# Patient Record
Sex: Female | Born: 1985 | Race: White | Hispanic: No | Marital: Single | Smoking: Never smoker
Health system: Southern US, Community
[De-identification: ages and names within clinical notes are randomized; demographics above are authoritative.]

## PROBLEM LIST (undated history)

## (undated) ENCOUNTER — Emergency Department: Admission: EM | Payer: 59

## (undated) DIAGNOSIS — T7840XA Allergy, unspecified, initial encounter: Secondary | ICD-10-CM

## (undated) DIAGNOSIS — N946 Dysmenorrhea, unspecified: Secondary | ICD-10-CM

## (undated) DIAGNOSIS — R634 Abnormal weight loss: Secondary | ICD-10-CM

## (undated) DIAGNOSIS — IMO0002 Reserved for concepts with insufficient information to code with codable children: Secondary | ICD-10-CM

## (undated) DIAGNOSIS — F439 Reaction to severe stress, unspecified: Secondary | ICD-10-CM

## (undated) DIAGNOSIS — L709 Acne, unspecified: Secondary | ICD-10-CM

## (undated) DIAGNOSIS — K209 Esophagitis, unspecified without bleeding: Secondary | ICD-10-CM

## (undated) DIAGNOSIS — N920 Excessive and frequent menstruation with regular cycle: Secondary | ICD-10-CM

## (undated) DIAGNOSIS — F419 Anxiety disorder, unspecified: Secondary | ICD-10-CM

## (undated) DIAGNOSIS — K219 Gastro-esophageal reflux disease without esophagitis: Secondary | ICD-10-CM

## (undated) DIAGNOSIS — L719 Rosacea, unspecified: Secondary | ICD-10-CM

## (undated) HISTORY — DX: Reaction to severe stress, unspecified: F43.9

## (undated) HISTORY — DX: Excessive and frequent menstruation with regular cycle: N92.0

## (undated) HISTORY — DX: Allergy, unspecified, initial encounter: T78.40XA

## (undated) HISTORY — DX: Reserved for concepts with insufficient information to code with codable children: IMO0002

## (undated) HISTORY — DX: Dysmenorrhea, unspecified: N94.6

## (undated) HISTORY — DX: Rosacea, unspecified: L71.9

## (undated) HISTORY — DX: Abnormal weight loss: R63.4

## (undated) HISTORY — DX: Anxiety disorder, unspecified: F41.9

## (undated) HISTORY — PX: LEEP: SHX91

## (undated) HISTORY — DX: Gastro-esophageal reflux disease without esophagitis: K21.9

## (undated) HISTORY — DX: Esophagitis, unspecified without bleeding: K20.90

## (undated) HISTORY — DX: Acne, unspecified: L70.9

## (undated) HISTORY — PX: EYE SURGERY: SHX253

## (undated) HISTORY — DX: Esophagitis, unspecified: K20.9

---

## 2005-04-18 ENCOUNTER — Ambulatory Visit: Payer: Self-pay | Admitting: Internal Medicine

## 2008-07-20 ENCOUNTER — Ambulatory Visit: Payer: Self-pay | Admitting: Internal Medicine

## 2009-06-11 ENCOUNTER — Ambulatory Visit: Payer: Self-pay | Admitting: General Practice

## 2010-12-10 IMAGING — US TRANSABDOMINAL ULTRASOUND OF PELVIS
1 series · 17 of 25 positions shown · non-contrast
Comparison: none

REASON FOR EXAM: continued spotting menstrual irregularities
COMMENTS:

[Series 1: transabdominal ultrasound of pelvis · 17 of 40 slices shown]
[im 1/40]
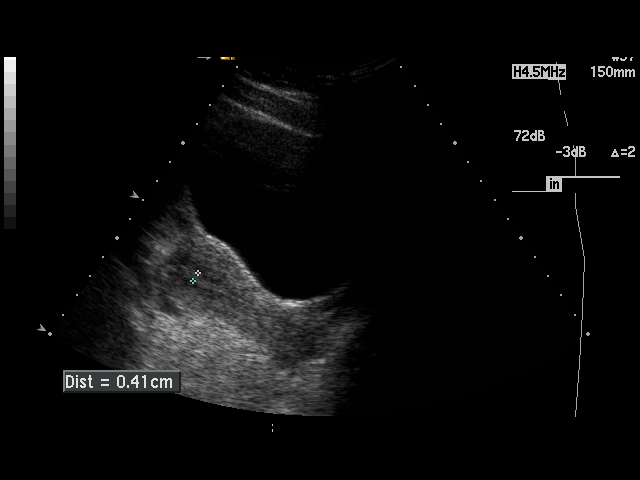
[im 4/40]
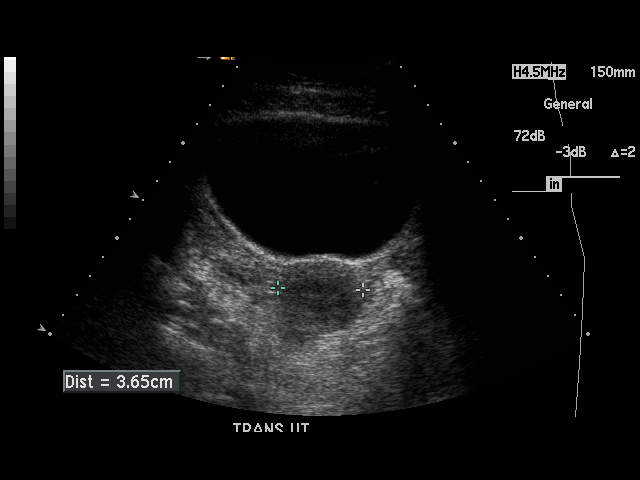
[im 5/40]
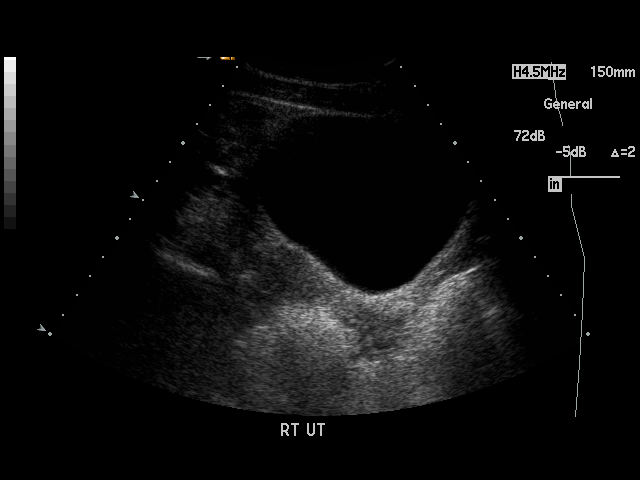
[im 9/40]
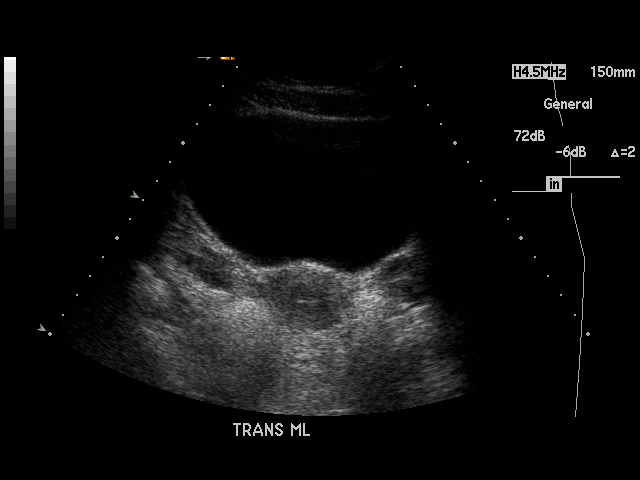
[im 10/40]
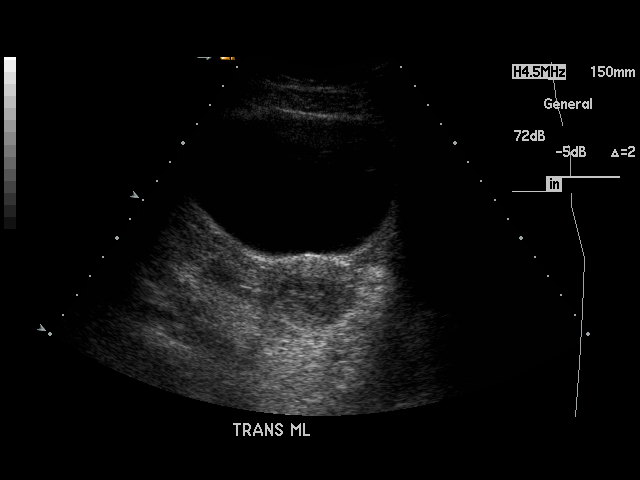
[im 14/40]
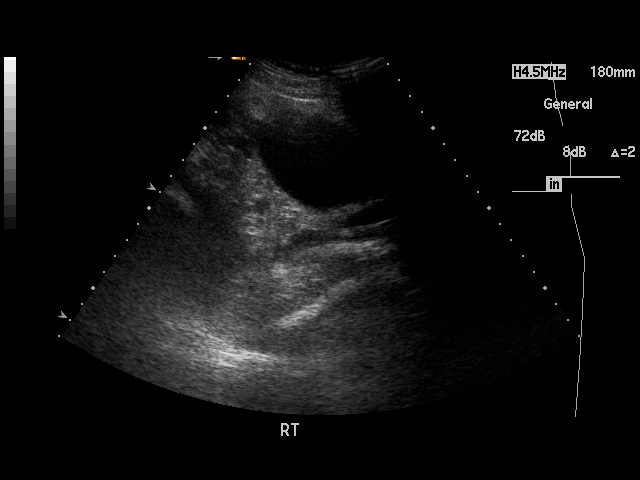
[im 15/40]
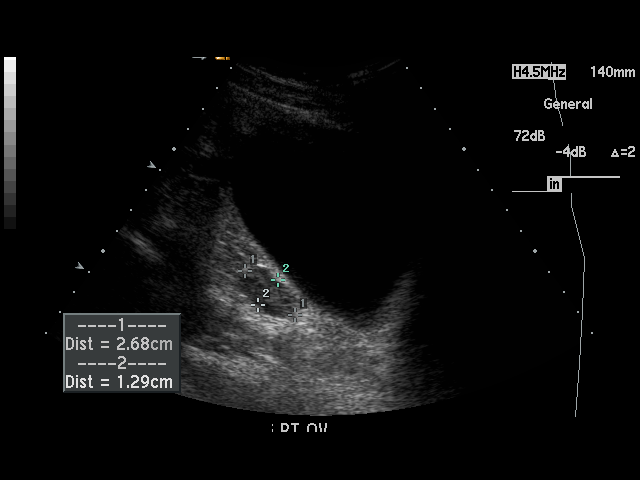
[im 18/40]
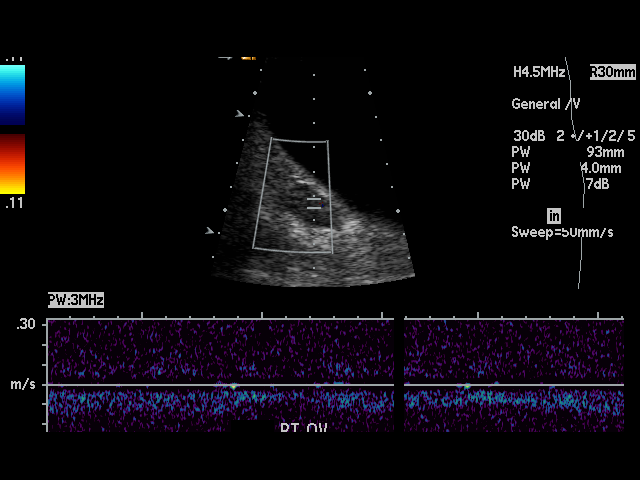
[im 20/40]
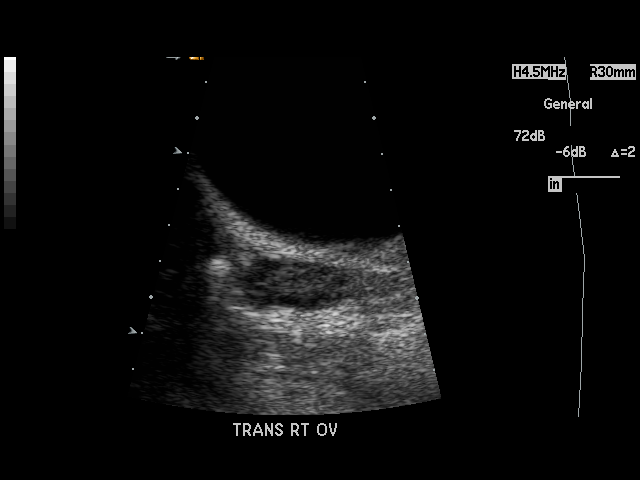
[im 22/40]
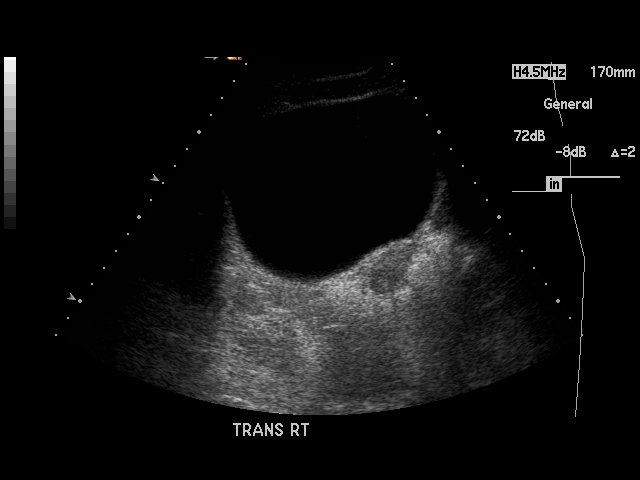
[im 25/40]
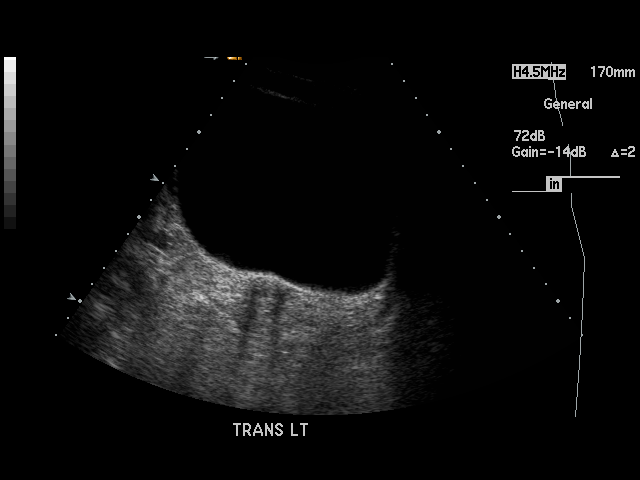
[im 27/40]
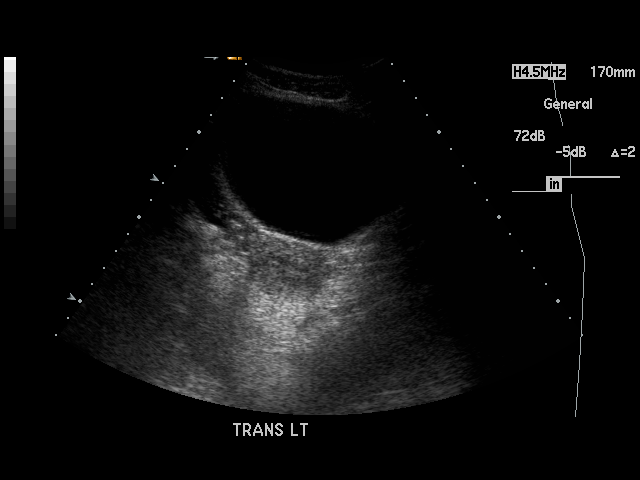
[im 30/40]
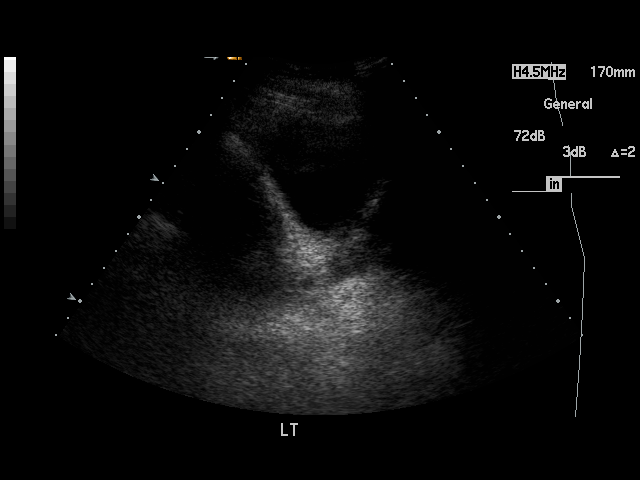
[im 31/40]
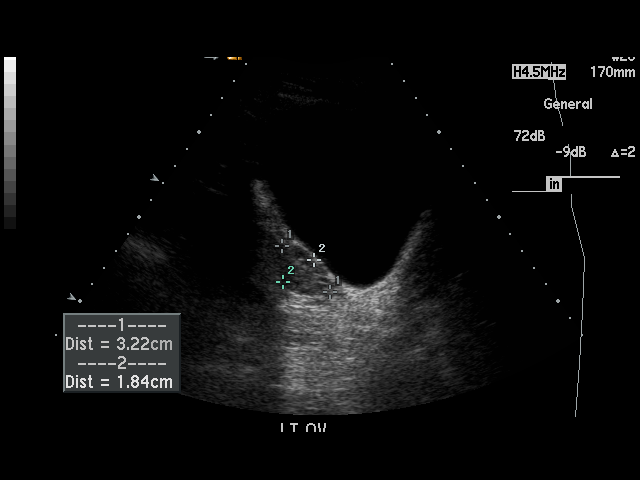
[im 35/40]
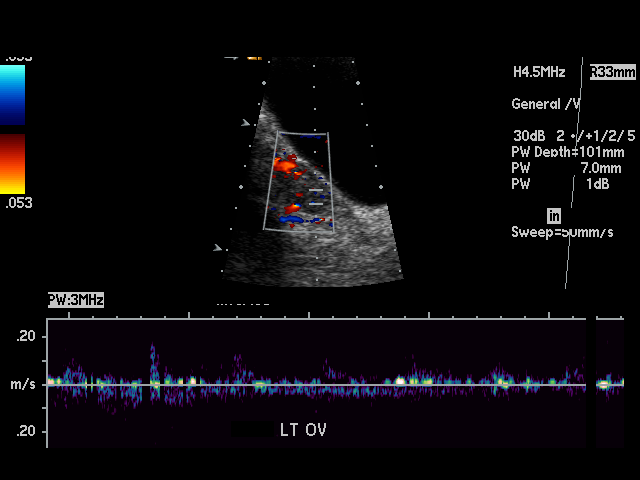
[im 36/40]
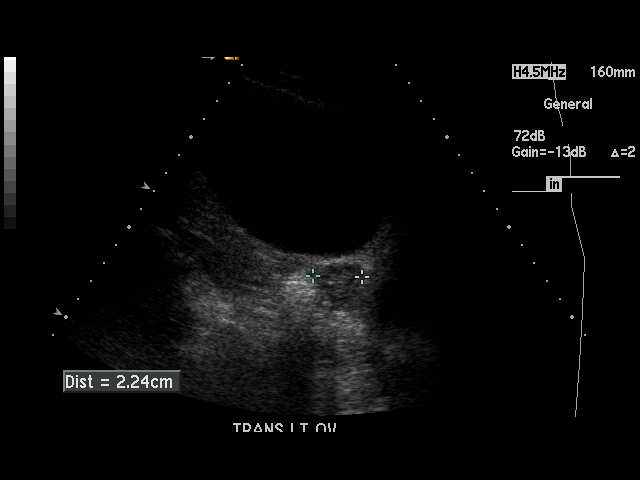
[im 40/40]
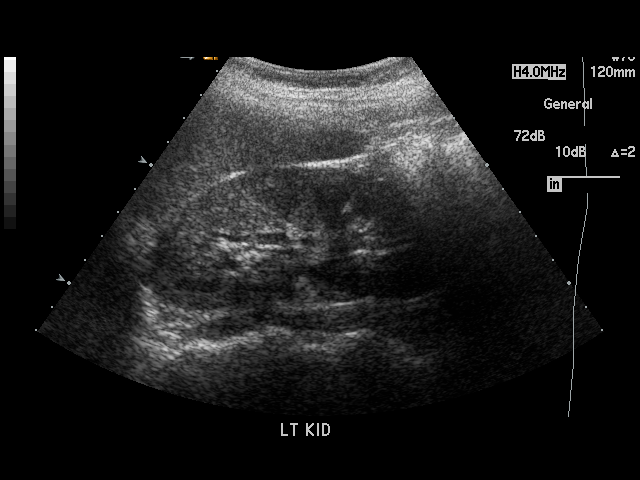

[17 of 25 positions shown; findings below may reference images not displayed]

PROCEDURE:     US  - US PELVIS MASS EXAM  - [DATE]  [DATE] [DATE]  [DATE]

RESULT:     The uterus measures 7.49 cm x 3.65 cm x 2.72 cm. No uterine mass
lesions are seen. The endometrium measures 4.1 mm in thickness. The right
and left ovaries are visualized. The right ovary measures 2.68 cm at maximum
diameter. The left ovary measures 3.22 cm at maximum diameter. No abnormal
adnexal masses are seen. There is no free fluid observed in the pelvis. The
visualized portion of the urinary bladder is normal in appearance. The
kidneys are visualized bilaterally and show no hydronephrosis.
IMPRESSION: No significant abnormalities are noted.

## 2011-11-01 IMAGING — CR DG CHEST 2V
1 series · 2 of 2 positions shown · non-contrast
Comparison: none

REASON FOR EXAM: respiratory clearance physical fax to [phone]
occupational health
COMMENTS:

PROCEDURE:     DXR - DXR CHEST 2 VIEWS JEANCARLO EMMERT  - June 11, 2009  [DATE]
RESULT:     The lungs are clear. The cardiac silhouette and visualized bony
skeleton are unremarkable.

[Series 1: view not recorded · 0.17mm/px · 2 of 2 slices shown]
[im 1/2]
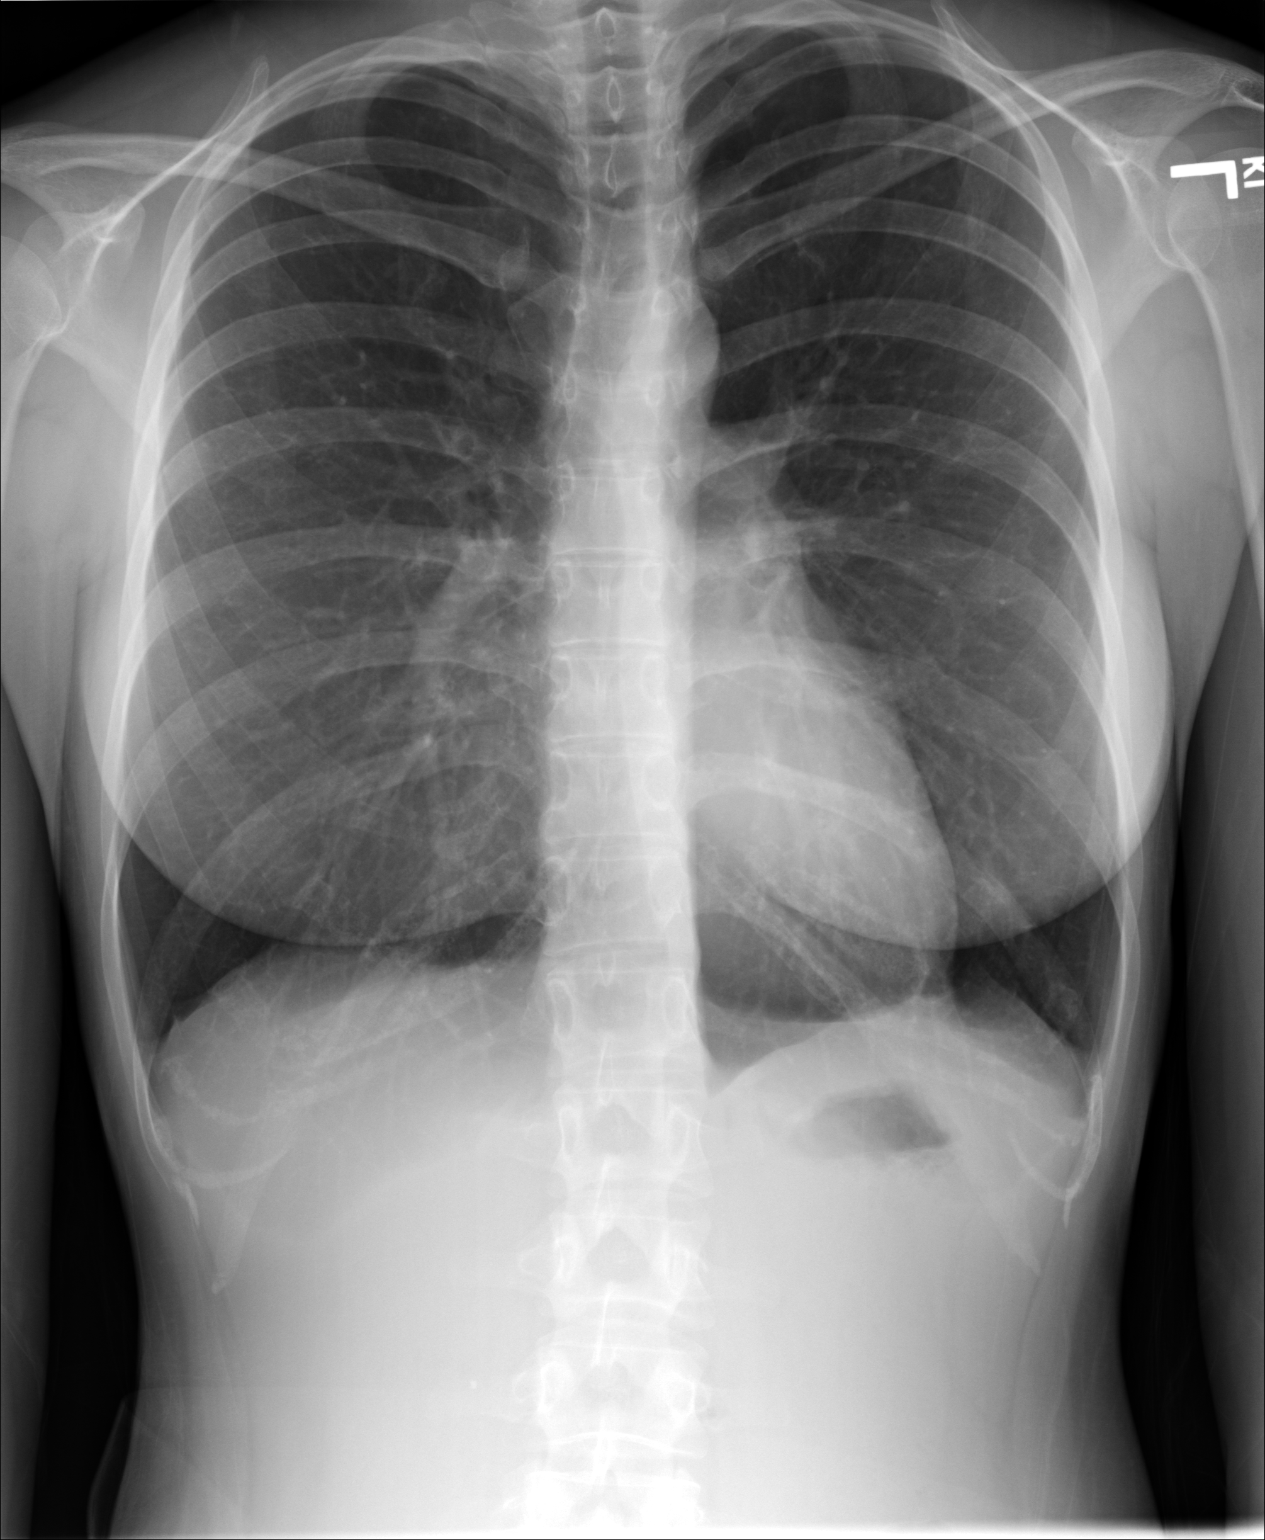
[im 2/2]
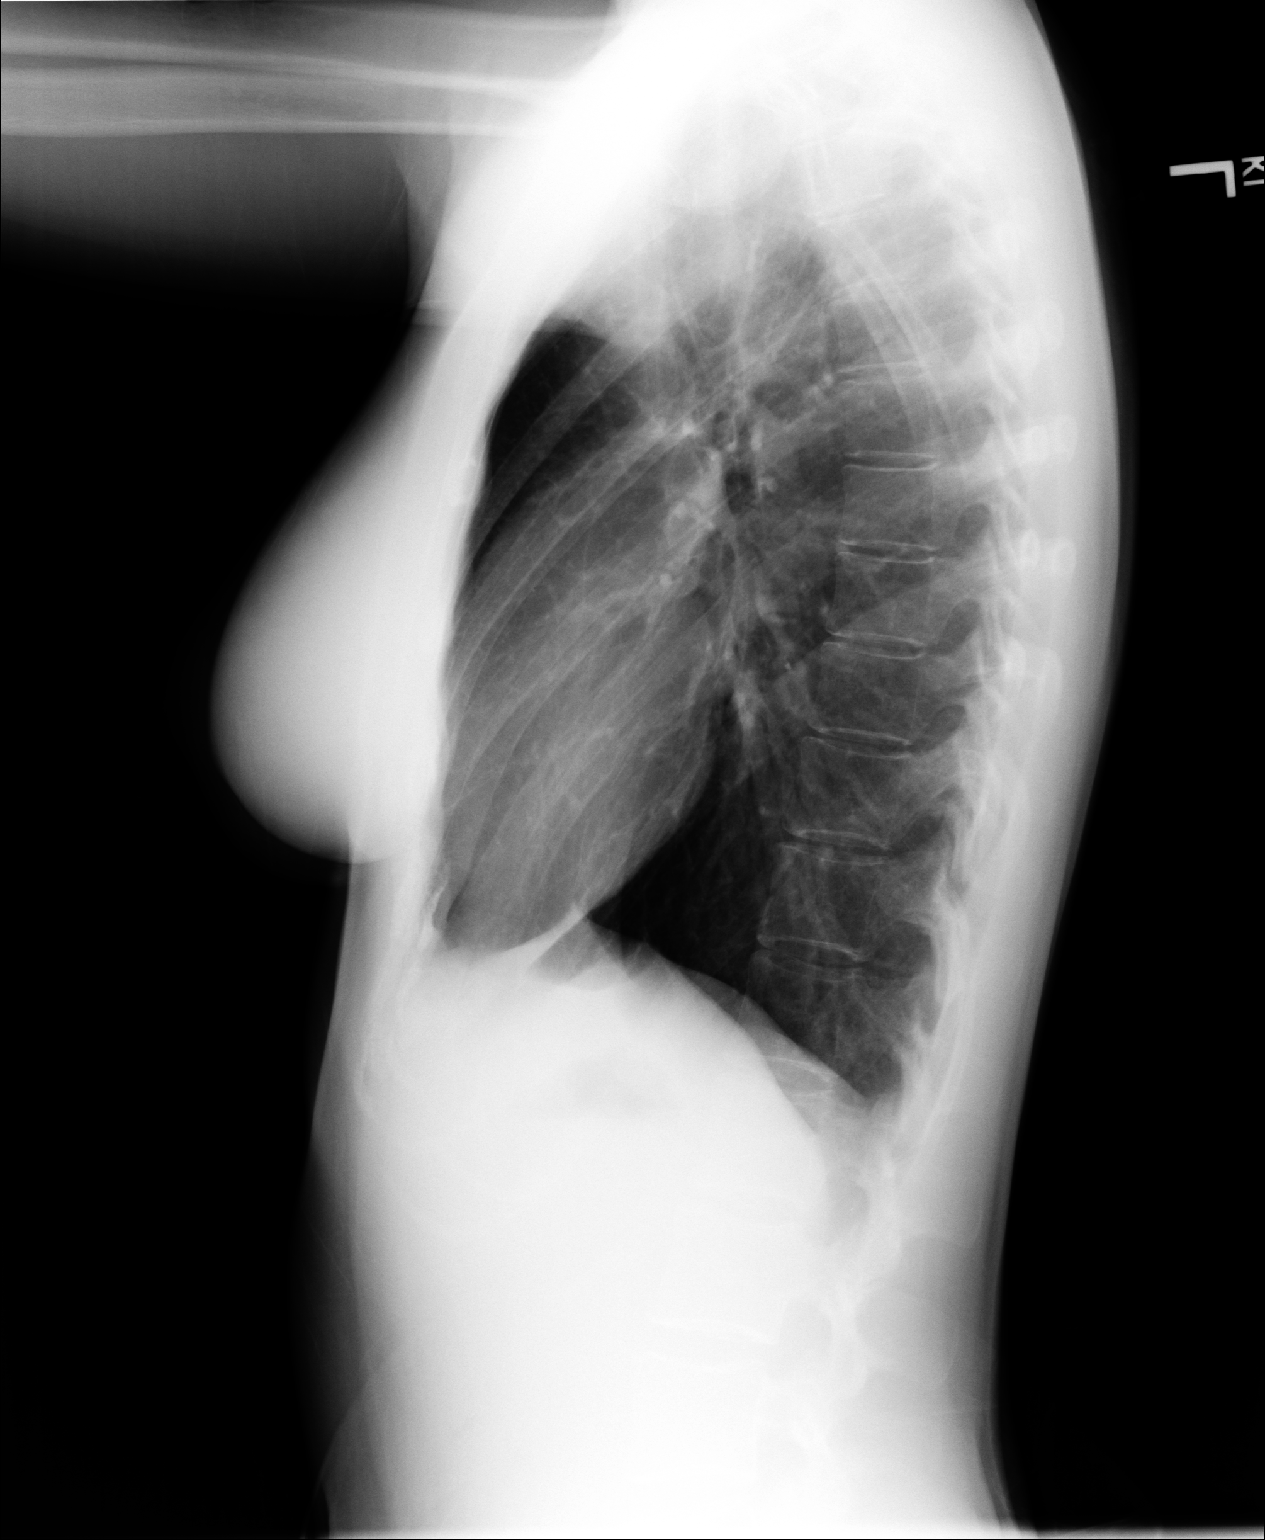

[2 of 2 positions shown; findings below may reference images not displayed]

IMPRESSION: 1. Chest radiograph without evidence of acute cardiopulmonary disease.

## 2012-03-09 ENCOUNTER — Ambulatory Visit (INDEPENDENT_AMBULATORY_CARE_PROVIDER_SITE_OTHER): Payer: 59 | Admitting: Internal Medicine

## 2012-03-09 ENCOUNTER — Encounter: Payer: Self-pay | Admitting: Internal Medicine

## 2012-03-09 VITALS — BP 107/71 | HR 72 | Temp 98.4°F | Ht 65.0 in | Wt 114.0 lb

## 2012-03-09 DIAGNOSIS — Z0000 Encounter for general adult medical examination without abnormal findings: Secondary | ICD-10-CM

## 2012-03-09 NOTE — Patient Instructions (Addendum)
It was nice seeing you today.  I am glad you have been doing well.  Let me know if you need anything. 

## 2012-03-09 NOTE — Progress Notes (Signed)
  Subjective:    Patient ID: Mikayla Brown, female    DOB: April 15, 1985, 26 y.o.   MRN: [id number]  HPI 25 year old female who presents for her complete physical exam.  States she is doing well.  No increased heart rate or palpitations.  No increased sob.  Eating and drinking well.  No bowel change.  No acid reflux.  Started her period today.  Periods regular.  Taking her OCPs and tolerating.  Overall she feels she is doing well.    Past Medical History  Diagnosis Date  . Acne     followed by dermatology    Outpatient Encounter Prescriptions as of 03/09/2012  Medication Sig Dispense Refill  . Norgestimate-Ethinyl Estradiol Triphasic (TRINESSA, 28,) 0.18/0.215/0.25 MG-35 MCG tablet Take 1 tablet by mouth daily.      Marland Kitchen doxycycline (VIBRAMYCIN) 100 MG capsule         Review of Systems Patient denies any headache, lightheadedness or dizziness.  No significant sinus or allergy symptoms.  No chest pain, tightness or palpitations.  No increased shortness of breath, cough or congestion.  No nausea or vomiting. No acid reflux, dysphagia or odynophagia.   No abdominal pain or cramping.  No bowel change, such as diarrhea, constipation, BRBPR or melana.  No urine change.        Objective:   Physical Exam Filed Vitals:   03/09/12 1335  BP: 107/71  Pulse: 72  Temp: 98.4 F (63.28 C)   26 year old female in no acute distress.   HEENT:  Nares- clear.  Oropharynx - without lesions. NECK:  Supple.  Nontender.  No audible bruit.  HEART:  Appears to be regular. LUNGS:  No crackles or wheezing audible.  Respirations even and unlabored.  RADIAL PULSE:  Equal bilaterally.    BREASTS:  No nipple discharge or nipple retraction present.  Could not appreciate any distinct nodules or axillary adenopathy.  ABDOMEN:  Soft, nontender.  Bowel sounds present and normal.  No audible abdominal bruit.  GU:  Unable to do secondary to her having her menstrual period.    RECTAL:  Not performed.    EXTREMITIES:  No  increased edema present.  DP pulses palpable and equal bilaterally.           Assessment & Plan:  CARDIOVASCULAR.  ECHO 03/13/11 revealed no significant valve abnormality and normal EF.  Asymptomatic.    GYN.   Unable to do her pelvic and pap today.  Having her period.  Continues on Trinessa.  Get her back in soon for her pap.    GI.  Asymptomatic.    HEALTH MAINTENANCE.  Physical today.  Have her return for her pelvic and pap.

## 2012-03-11 ENCOUNTER — Encounter: Payer: Self-pay | Admitting: Internal Medicine

## 2012-04-20 ENCOUNTER — Encounter: Payer: Self-pay | Admitting: Internal Medicine

## 2012-04-20 ENCOUNTER — Ambulatory Visit (INDEPENDENT_AMBULATORY_CARE_PROVIDER_SITE_OTHER): Payer: 59 | Admitting: Internal Medicine

## 2012-04-20 VITALS — BP 101/68 | HR 79 | Temp 98.7°F | Ht 65.0 in | Wt 117.0 lb

## 2012-04-20 DIAGNOSIS — Z01419 Encounter for gynecological examination (general) (routine) without abnormal findings: Secondary | ICD-10-CM

## 2012-04-20 MED ORDER — NORGESTIM-ETH ESTRAD TRIPHASIC 0.18/0.215/0.25 MG-35 MCG PO TABS: 1.0000 | ORAL_TABLET | Freq: Every day | ORAL | Status: DC

## 2012-05-13 ENCOUNTER — Encounter: Payer: Self-pay | Admitting: Internal Medicine

## 2012-05-13 NOTE — Progress Notes (Signed)
  Subjective:    Patient ID: Mikayla Brown, female    DOB: 1985-06-22, 27 y.o.   MRN: [id number]  Gynecologic Exam  27 year old female who presents for her pelvic/pap.  States she is doing well.  No increased heart rate or palpitations.  No increased sob.  Eating and drinking well.  No bowel change.  No acid reflux.  Periods regular.  Taking her OCPs and tolerating.  Overall she feels she is doing well.  LMP 10 days ago.    Past Medical History  Diagnosis Date  . Acne     followed by dermatology    Outpatient Encounter Prescriptions as of 04/20/2012  Medication Sig Dispense Refill  . Norgestimate-Ethinyl Estradiol Triphasic (TRINESSA, 28,) 0.18/0.215/0.25 MG-35 MCG tablet Take 1 tablet by mouth daily.  3 Package  3  . [DISCONTINUED] Norgestimate-Ethinyl Estradiol Triphasic (TRINESSA, 28,) 0.18/0.215/0.25 MG-35 MCG tablet Take 1 tablet by mouth daily.      . [DISCONTINUED] doxycycline (VIBRAMYCIN) 100 MG capsule         Review of Systems Patient denies any headache, lightheadedness or dizziness.  No significant sinus or allergy symptoms.  No chest pain, tightness or palpitations.  No increased shortness of breath, cough or congestion.  No nausea or vomiting. No acid reflux, dysphagia or odynophagia.   No abdominal pain or cramping.  No bowel change, such as diarrhea, constipation, BRBPR or melana.  No urine change.        Objective:   Physical Exam  Filed Vitals:   04/20/12 0912  BP: 101/68  Pulse: 79  Temp: 98.7 F (79.29 C)   27 year old female in no acute distress.   HEENT:  Nares- clear.  Oropharynx - without lesions. NECK:  Supple.  Nontender.  No audible bruit.  HEART:  Appears to be regular. LUNGS:  No crackles or wheezing audible.  Respirations even and unlabored.  RADIAL PULSE:  Equal bilaterally. ABDOMEN:  Soft, nontender.  Bowel sounds present and normal.  No audible abdominal bruit.  GU:  Normal external genitalia.  Vaginal vault without lesions.  Cervix identified.   Pap performed.  Could not appreciate any adnexal masses or tenderness.     RECTAL:  Not performed.    EXTREMITIES:  No increased edema present.  DP pulses palpable and equal bilaterally.           Assessment & Plan:  CARDIOVASCULAR.  ECHO 03/13/11 revealed no significant valve abnormality and normal EF.  Asymptomatic.    GYN.   Pelvic/pap today.     GI.  Asymptomatic.    HEALTH MAINTENANCE.  Pelvic/pap today.

## 2012-05-21 ENCOUNTER — Telehealth: Payer: Self-pay | Admitting: *Deleted

## 2012-05-21 NOTE — Telephone Encounter (Signed)
Patient returning call from "someone" not sure what the call was about pt thought maybe it was about her labs or pap results? Please advise

## 2012-05-24 NOTE — Telephone Encounter (Signed)
Called and left pt message to call back.  Still unable to reach.

## 2012-05-26 NOTE — Telephone Encounter (Signed)
I have tried multiple times again to get in touch with her regarding her pap smear.  I left another message for her to call out office.  When she calls -go ahead and let her know her pap was abnormal.  (ASCUS with positive HPV).  She needs referral to gyn for evaluation.  Let me know what gyn she wants to see and I will place order for referral.

## 2012-05-29 ENCOUNTER — Other Ambulatory Visit: Payer: Self-pay

## 2012-05-31 NOTE — Telephone Encounter (Signed)
Left message for patient to return call.

## 2012-06-01 ENCOUNTER — Encounter: Payer: Self-pay | Admitting: *Deleted

## 2012-06-01 NOTE — Telephone Encounter (Signed)
Letter sent to patient after calling and leaving another voice mail.

## 2012-06-09 ENCOUNTER — Telehealth: Payer: Self-pay | Admitting: Internal Medicine

## 2012-06-09 NOTE — Telephone Encounter (Signed)
Asking for Pap results.  Pt states she works third shift and first thing in the morning would be best time to contact her at [phone].

## 2012-06-14 ENCOUNTER — Telehealth: Payer: Self-pay | Admitting: Internal Medicine

## 2012-06-14 NOTE — Telephone Encounter (Signed)
Call from patients Mother wanting the results of PAP performed in January. She relates that they have called multiple times for the results. Patient works nights and does not answer the phone while sleeping. Discussed with Dr. Lorin Picket and she has made multiple attempts to speak with the patient calling on the weekends, different times of the day etc.  without a response from the patient . Dr. Lorin Picket called the patient today and left a message regarding the need for her to call so that she can discuss her PAP results with her.

## 2012-06-15 NOTE — Telephone Encounter (Signed)
Refer to other message from 06/14/12

## 2012-07-30 ENCOUNTER — Telehealth: Payer: Self-pay | Admitting: Internal Medicine

## 2012-07-30 DIAGNOSIS — IMO0002 Reserved for concepts with insufficient information to code with codable children: Secondary | ICD-10-CM

## 2012-07-30 NOTE — Telephone Encounter (Signed)
Order placed for referral to gyn - abnormal pap

## 2012-08-04 ENCOUNTER — Ambulatory Visit: Payer: 59 | Admitting: Internal Medicine

## 2012-09-08 ENCOUNTER — Encounter: Payer: Self-pay | Admitting: Internal Medicine

## 2013-02-17 ENCOUNTER — Other Ambulatory Visit: Payer: Self-pay

## 2013-03-14 ENCOUNTER — Other Ambulatory Visit: Payer: Self-pay | Admitting: *Deleted

## 2013-03-14 ENCOUNTER — Telehealth: Payer: Self-pay | Admitting: Internal Medicine

## 2013-03-14 MED ORDER — NORGESTIM-ETH ESTRAD TRIPHASIC 0.18/0.215/0.25 MG-35 MCG PO TABS: 1.0000 | ORAL_TABLET | Freq: Every day | ORAL | Status: DC

## 2013-03-14 NOTE — Telephone Encounter (Signed)
Rx was sent to [facility] for a 90 day supply

## 2013-03-14 NOTE — Telephone Encounter (Signed)
Pt has cpe scheduled 1/7.  Will run out of birth control pills before then.  Asking for refill sooner.

## 2013-04-20 ENCOUNTER — Other Ambulatory Visit (HOSPITAL_COMMUNITY)
Admission: RE | Admit: 2013-04-20 | Discharge: 2013-04-20 | Disposition: A | Discharge status: Home / Self Care | Payer: 59 | Source: Ambulatory Visit | Attending: Internal Medicine | Admitting: Internal Medicine

## 2013-04-20 ENCOUNTER — Ambulatory Visit (INDEPENDENT_AMBULATORY_CARE_PROVIDER_SITE_OTHER): Payer: 59 | Admitting: Internal Medicine

## 2013-04-20 ENCOUNTER — Encounter: Payer: Self-pay | Admitting: Internal Medicine

## 2013-04-20 VITALS — BP 110/70 | HR 73 | Temp 97.9°F | Ht 64.2 in | Wt 111.5 lb

## 2013-04-20 DIAGNOSIS — R87619 Unspecified abnormal cytological findings in specimens from cervix uteri: Secondary | ICD-10-CM | POA: Insufficient documentation

## 2013-04-20 DIAGNOSIS — Z87898 Personal history of other specified conditions: Secondary | ICD-10-CM

## 2013-04-20 DIAGNOSIS — Z01419 Encounter for gynecological examination (general) (routine) without abnormal findings: Secondary | ICD-10-CM | POA: Insufficient documentation

## 2013-04-20 DIAGNOSIS — Z0000 Encounter for general adult medical examination without abnormal findings: Secondary | ICD-10-CM

## 2013-04-20 DIAGNOSIS — Z8742 Personal history of other diseases of the female genital tract: Secondary | ICD-10-CM

## 2013-04-20 DIAGNOSIS — Z1151 Encounter for screening for human papillomavirus (HPV): Secondary | ICD-10-CM | POA: Insufficient documentation

## 2013-04-20 NOTE — Progress Notes (Signed)
  Subjective:    Patient ID: Mikayla Brown, female    DOB: [birth year], 28 y.o.   MRN: [id number]  HPI 28 year old female who presents for her complete physical exam.  States she is doing well.  No increased heart rate or palpitations.  No increased sob.  Eating and drinking well.  No bowel change.  No acid reflux.   Periods regular.  Taking her OCPs and tolerating.  Overall she feels she is doing well.  Had an abnormal pap smear.  Saw gyn.  W/up negative per patient.    Past Medical History  Diagnosis Date  . Acne     followed by dermatology    Outpatient Encounter Prescriptions as of 04/20/2013  Medication Sig  . doxycycline (ORACEA) 40 MG capsule Take 40 mg by mouth every morning.  . Norgestimate-Ethinyl Estradiol Triphasic (TRINESSA, 28,) 0.18/0.215/0.25 MG-35 MCG tablet Take 1 tablet by mouth daily.    Review of Systems Patient denies any headache, lightheadedness or dizziness.  No significant sinus or allergy symptoms.  No chest pain, tightness or palpitations.  No increased shortness of breath, cough or congestion.  No nausea or vomiting. No acid reflux, dysphagia or odynophagia.   No abdominal pain or cramping.  No bowel change, such as diarrhea, constipation, BRBPR or melana.  No urine change.  Periods regular.       Objective:   Physical Exam  Filed Vitals:   04/20/13 0806  BP: 110/70  Pulse: 73  Temp: 97.9 F (8536.816 C)   28 year old female in no acute distress.   HEENT:  Nares- clear.  Oropharynx - without lesions. NECK:  Supple.  Nontender.  No audible bruit.  HEART:  Appears to be regular. LUNGS:  No crackles or wheezing audible.  Respirations even and unlabored.  RADIAL PULSE:  Equal bilaterally.    BREASTS:  No nipple discharge or nipple retraction present.  Could not appreciate any distinct nodules or axillary adenopathy.  ABDOMEN:  Soft, nontender.  Bowel sounds present and normal.  No audible abdominal bruit.  GU:  Normal external genitalia.  Vaginal vault  without lesions.  Cervix identified.  Pap performed. Could not appreciate any adnexal masses or tenderness.   RECTAL:  Not performed.   EXTREMITIES:  No increased edema present.  DP pulses palpable and equal bilaterally.           Assessment & Plan:  CARDIOVASCULAR.  ECHO 03/13/11 revealed no significant valve abnormality and normal EF.  Asymptomatic.     GI.  Asymptomatic.    HEALTH MAINTENANCE.  Physical today.   Pelvic and pap today.  Check routine labs and cholesterol.   I spent 25 minutes with the patient and more than 50% of the time was spent in consultation regarding the above.

## 2013-04-20 NOTE — Addendum Note (Signed)
Addended by: Montine CircleMALDONADO, Sheily Lineman D on: 04/20/2013 09:06 AM   Modules accepted: Orders

## 2013-04-20 NOTE — Assessment & Plan Note (Addendum)
History of abnormal pap smear.  Was referred to gyn.  W/up negative per patient.  Repeat pelvic and pap today.

## 2013-04-20 NOTE — Progress Notes (Signed)
Pre-visit discussion using our clinic review tool. No additional management support is needed unless otherwise documented below in the visit note.  

## 2013-04-21 ENCOUNTER — Encounter: Payer: Self-pay | Admitting: Internal Medicine

## 2013-04-21 ENCOUNTER — Telehealth: Payer: Self-pay | Admitting: Internal Medicine

## 2013-04-21 NOTE — Telephone Encounter (Signed)
Pt notified of pap smear results via my chart.  Please schedule her for an appt for a 6 month f/u pap.  Thanks.

## 2013-04-22 NOTE — Telephone Encounter (Signed)
Appointment 10/20/13  Send pt my chart message with appointment date and time

## 2013-04-29 ENCOUNTER — Other Ambulatory Visit: Payer: Self-pay | Admitting: Internal Medicine

## 2013-05-18 ENCOUNTER — Telehealth: Payer: Self-pay | Admitting: Internal Medicine

## 2013-05-18 NOTE — Telephone Encounter (Signed)
Pt left message on 05/16/13 she had questions about bill.  Left message for pt to call office 05/18/13

## 2013-05-21 ENCOUNTER — Telehealth: Payer: Self-pay | Admitting: Internal Medicine

## 2013-05-21 NOTE — Telephone Encounter (Signed)
Pt notified labs ok via my chart.

## 2013-06-06 LAB — CBC WITH DIFFERENTIAL
BASOS ABS: 0 10*3/uL (ref 0.0–0.2)
Basos: 0 %
Eos: 2 %
Eosinophils Absolute: 0.1 10*3/uL (ref 0.0–0.4)
HCT: 38.6 % (ref 34.0–46.6)
Hemoglobin: 12.8 g/dL (ref 11.1–15.9)
IMMATURE GRANULOCYTES: 0 %
Immature Grans (Abs): 0 10*3/uL (ref 0.0–0.1)
LYMPHS ABS: 2.8 10*3/uL (ref 0.7–3.1)
Lymphs: 47 %
MCH: 29.4 pg (ref 26.6–33.0)
MCHC: 33.2 g/dL (ref 31.5–35.7)
MCV: 89 fL (ref 79–97)
Monocytes Absolute: 0.4 10*3/uL (ref 0.1–0.9)
Monocytes: 7 %
Neutrophils Absolute: 2.6 10*3/uL (ref 1.4–7.0)
Neutrophils Relative %: 44 %
PLATELETS: 241 10*3/uL (ref 150–379)
RBC: 4.36 x10E6/uL (ref 3.77–5.28)
RDW: 12.2 % — AB (ref 12.3–15.4)
WBC: 5.9 10*3/uL (ref 3.4–10.8)

## 2013-06-06 LAB — COMPREHENSIVE METABOLIC PANEL
ALK PHOS: 38 IU/L — AB (ref 39–117)
ALT: 7 IU/L (ref 0–32)
AST: 12 IU/L (ref 0–40)
Albumin/Globulin Ratio: 1.9 (ref 1.1–2.5)
Albumin: 4 g/dL (ref 3.5–5.5)
BUN / CREAT RATIO: 16 (ref 8–20)
BUN: 10 mg/dL (ref 6–20)
CHLORIDE: 102 mmol/L (ref 97–108)
CO2: 24 mmol/L (ref 18–29)
Calcium: 8.9 mg/dL (ref 8.7–10.2)
Creatinine, Ser: 0.61 mg/dL (ref 0.57–1.00)
GFR calc Af Amer: 144 mL/min/{1.73_m2} (ref >59)
GFR calc non Af Amer: 125 mL/min/{1.73_m2} (ref >59)
Globulin, Total: 2.1 g/dL (ref 1.5–4.5)
Glucose: 84 mg/dL (ref 65–99)
Potassium: 4.2 mmol/L (ref 3.5–5.2)
Sodium: 140 mmol/L (ref 134–144)
Total Bilirubin: 0.5 mg/dL (ref 0.0–1.2)
Total Protein: 6.1 g/dL (ref 6.0–8.5)

## 2013-06-06 LAB — TSH: TSH: 2.06 u[IU]/mL (ref 0.450–4.500)

## 2013-06-06 LAB — LIPID PANEL W/O CHOL/HDL RATIO
CHOLESTEROL TOTAL: 142 mg/dL (ref 100–199)
HDL: 57 mg/dL (ref >39)
LDL Calculated: 71 mg/dL (ref 0–99)
Triglycerides: 70 mg/dL (ref 0–149)
VLDL Cholesterol Cal: 14 mg/dL (ref 5–40)

## 2013-06-10 NOTE — Telephone Encounter (Signed)
Pt received a bill for $274 but no payment due. Pt is confused & not sure if she owes anything or what was filed to insurance. (Pt states that she has called twice to resolve this issue)

## 2013-06-12 ENCOUNTER — Encounter: Payer: Self-pay | Admitting: Internal Medicine

## 2013-06-13 NOTE — Telephone Encounter (Signed)
I left message for patient to return my call. I have also sent an email to Darl PikesSusan since this patient was charged as an office visit when it states she was here for a CPE. If patient calls back let her know that I will contact her once I hear from the billing department.

## 2013-07-03 NOTE — Telephone Encounter (Signed)
Can you please charge correct DOS 04/20/13-Change CPT 99214 to [phone] with V70.0 as the diagnosis on above patient.   Thank you,  Georga BoraDawn Herrington, CPC-A  And [facility] 06/28/13 [facility]  I have notified patient via My chart to let her know that this has been taken care of.

## 2013-07-12 ENCOUNTER — Other Ambulatory Visit: Payer: Self-pay | Admitting: Internal Medicine

## 2013-10-20 ENCOUNTER — Ambulatory Visit: Payer: 59 | Admitting: Internal Medicine

## 2013-12-22 ENCOUNTER — Other Ambulatory Visit (HOSPITAL_COMMUNITY)
Admission: RE | Admit: 2013-12-22 | Discharge: 2013-12-22 | Disposition: A | Discharge status: Home / Self Care | Payer: 59 | Source: Ambulatory Visit | Attending: Internal Medicine | Admitting: Internal Medicine

## 2013-12-22 ENCOUNTER — Ambulatory Visit (INDEPENDENT_AMBULATORY_CARE_PROVIDER_SITE_OTHER): Payer: 59 | Admitting: Internal Medicine

## 2013-12-22 ENCOUNTER — Encounter: Payer: Self-pay | Admitting: Internal Medicine

## 2013-12-22 VITALS — BP 100/60 | HR 84 | Temp 99.3°F | Ht 64.2 in | Wt 112.2 lb

## 2013-12-22 DIAGNOSIS — Z01419 Encounter for gynecological examination (general) (routine) without abnormal findings: Secondary | ICD-10-CM | POA: Insufficient documentation

## 2013-12-22 DIAGNOSIS — R1111 Vomiting without nausea: Secondary | ICD-10-CM

## 2013-12-22 DIAGNOSIS — Z124 Encounter for screening for malignant neoplasm of cervix: Secondary | ICD-10-CM

## 2013-12-22 DIAGNOSIS — Z733 Stress, not elsewhere classified: Secondary | ICD-10-CM

## 2013-12-22 DIAGNOSIS — R1110 Vomiting, unspecified: Secondary | ICD-10-CM

## 2013-12-22 DIAGNOSIS — F439 Reaction to severe stress, unspecified: Secondary | ICD-10-CM

## 2013-12-22 DIAGNOSIS — K209 Esophagitis, unspecified without bleeding: Secondary | ICD-10-CM

## 2013-12-22 DIAGNOSIS — Z1151 Encounter for screening for human papillomavirus (HPV): Secondary | ICD-10-CM | POA: Insufficient documentation

## 2013-12-22 NOTE — Assessment & Plan Note (Addendum)
History of abnormal pap smear.  Was referred to gyn.  W/up negative per patient.  Repeat pelvic and pap six months ago negative.  Repeat pap today to confirm clearance remains.

## 2013-12-22 NOTE — Progress Notes (Signed)
Pre visit review using our clinic review tool, if applicable. No additional management support is needed unless otherwise documented below in the visit note. 

## 2013-12-22 NOTE — Progress Notes (Signed)
Subjective:    Patient ID: Mikayla Brown, female    DOB: 1986-04-04, 28 y.o.   MRN: [id number]  HPI 28 year old female who presents for a scheduled follow up and for a repeat pap.   States she is doing relatively well. Increased stress over the summer.  Her fiancee is having some family issues.  Her grandmother passed away.  Work stable.  Some increased stress with some of her pets.  Overall she feels she is handling things relatively well.  Did have an episode of vomiting yesterday.  States was just acid.  After the episode, felt fine.  Was able to eat and no further nausea or vomiting.  This was the first occurrence like this.  Feels fine today.  Has been eating with no nausea or vomiting since.  Had an episode a few weeks ago, where she had vomiting and diarrhea.  Ate at a ball game.  Her fiancee and her mother were also sick.  No other significant bowel issues.   No increased heart rate or palpitations.  No increased sob.  Eating and drinking well.   No acid reflux.   Periods regular.  Taking her OCPs and tolerating.  Overall she feels she is doing well.  Had an abnormal pap smear.  Saw gyn.  W/up negative.  Repeat here x 1 ok.  Here for f/u pap to confirm remains negative.     Past Medical History  Diagnosis Date  . Acne     followed by dermatology    Outpatient Encounter Prescriptions as of 12/22/2013  Medication Sig  . doxycycline (ORACEA) 40 MG capsule Take 40 mg by mouth every morning.  . TRI-SPRINTEC 0.18/0.215/0.25 MG-35 MCG tablet Take 1 tablet by mouth  daily    Review of Systems Patient denies any headache, lightheadedness or dizziness.  No significant sinus or allergy symptoms.  No chest pain, tightness or palpitations.  No increased shortness of breath, cough or congestion.  Vomiting as outlined.   No acid reflux, dysphagia or odynophagia.   No abdominal pain or cramping.  No bowel change, such as diarrhea, constipation, BRBPR or melana.  No urine change.  Periods regular.   Increased stress as outlined.       Objective:   Physical Exam  Filed Vitals:   12/22/13 0854  BP: 100/60  Pulse: 84  Temp: 99.3 F (62.80 C)   28 year old female in no acute distress.   HEENT:  Nares- clear.  Oropharynx - without lesions. NECK:  Supple.  Nontender.  No audible bruit.  HEART:  Appears to be regular. LUNGS:  No crackles or wheezing audible.  Respirations even and unlabored.  RADIAL PULSE:  Equal bilaterally.  ABDOMEN:  Soft, nontender.  Bowel sounds present and normal.  No audible abdominal bruit.  GU:  Normal external genitalia.  Vaginal vault without lesions.  Cervix identified.  Pap performed. Question of tissue fullness cervix.  Could not appreciate any adnexal masses or tenderness.   RECTAL:  Not performed.   EXTREMITIES:  No increased edema present.  DP pulses palpable and equal bilaterally.           Assessment & Plan:  CARDIOVASCULAR.  ECHO 03/13/11 revealed no significant valve abnormality and normal EF.  Asymptomatic.     GI.  Asymptomatic.    HEALTH MAINTENANCE.  Physical last visit.   Repeat pelvic and pap today.    I spent 25 minutes with the patient and more than 50% of the time  was spent in consultation regarding the above.

## 2013-12-24 LAB — CBC AND DIFFERENTIAL
HEMATOCRIT: 39 % (ref 36–46)
Hemoglobin: 13.1 g/dL (ref 12.0–16.0)
Neutrophils Absolute: 3 /uL
PLATELETS: 270 10*3/uL (ref 150–399)
WBC: 7.1 10*3/mL

## 2013-12-24 LAB — HEPATIC FUNCTION PANEL
ALT: 9 U/L (ref 7–35)
AST: 11 U/L — AB (ref 13–35)
Alkaline Phosphatase: 41 U/L (ref 25–125)
Bilirubin, Direct: 0.17 mg/dL (ref 0.01–0.4)
Bilirubin, Total: 0.7 mg/dL

## 2013-12-24 LAB — BASIC METABOLIC PANEL
BUN: 8 mg/dL (ref 4–21)
Creatinine: 0.7 mg/dL (ref 0.5–1.1)
Glucose: 86 mg/dL
POTASSIUM: 4.2 mmol/L (ref 3.4–5.3)
Sodium: 139 mmol/L (ref 137–147)

## 2013-12-25 ENCOUNTER — Encounter: Payer: Self-pay | Admitting: Internal Medicine

## 2013-12-25 DIAGNOSIS — F439 Reaction to severe stress, unspecified: Secondary | ICD-10-CM | POA: Insufficient documentation

## 2013-12-25 DIAGNOSIS — K209 Esophagitis, unspecified without bleeding: Secondary | ICD-10-CM | POA: Insufficient documentation

## 2013-12-25 DIAGNOSIS — R1110 Vomiting, unspecified: Secondary | ICD-10-CM | POA: Insufficient documentation

## 2013-12-25 NOTE — Assessment & Plan Note (Signed)
Feels the episode of vomiting yesterday was related to increased stress.  Denies any increased acid reflux, etc.  Will follow.

## 2013-12-25 NOTE — Assessment & Plan Note (Signed)
Increased stress as outlined.  Feels she is handling things relatively well.  Follow.   

## 2013-12-25 NOTE — Assessment & Plan Note (Signed)
Has a history of esophagitis.  Treated.  Has been doing well on no medication.  Follow.

## 2013-12-26 ENCOUNTER — Encounter: Payer: Self-pay | Admitting: Internal Medicine

## 2013-12-26 ENCOUNTER — Telehealth: Payer: Self-pay | Admitting: Internal Medicine

## 2013-12-26 LAB — CYTOLOGY - PAP

## 2013-12-26 NOTE — Telephone Encounter (Signed)
Pt notified labs ok via my chart.

## 2014-01-02 ENCOUNTER — Encounter: Payer: Self-pay | Admitting: Internal Medicine

## 2014-01-09 ENCOUNTER — Other Ambulatory Visit: Payer: Self-pay | Admitting: Internal Medicine

## 2014-04-21 ENCOUNTER — Encounter: Payer: Self-pay | Admitting: Internal Medicine

## 2014-06-22 ENCOUNTER — Ambulatory Visit (INDEPENDENT_AMBULATORY_CARE_PROVIDER_SITE_OTHER): Payer: Self-pay | Admitting: Internal Medicine

## 2014-06-22 ENCOUNTER — Encounter: Payer: Self-pay | Admitting: Internal Medicine

## 2014-06-22 VITALS — BP 110/70 | HR 56 | Temp 98.4°F | Ht 63.5 in | Wt 109.1 lb

## 2014-06-22 DIAGNOSIS — K209 Esophagitis, unspecified without bleeding: Secondary | ICD-10-CM

## 2014-06-22 DIAGNOSIS — Z0000 Encounter for general adult medical examination without abnormal findings: Secondary | ICD-10-CM

## 2014-06-22 DIAGNOSIS — Z658 Other specified problems related to psychosocial circumstances: Secondary | ICD-10-CM

## 2014-06-22 DIAGNOSIS — R634 Abnormal weight loss: Secondary | ICD-10-CM

## 2014-06-22 DIAGNOSIS — F439 Reaction to severe stress, unspecified: Secondary | ICD-10-CM

## 2014-06-22 DIAGNOSIS — R87619 Unspecified abnormal cytological findings in specimens from cervix uteri: Secondary | ICD-10-CM

## 2014-06-22 NOTE — Progress Notes (Signed)
Patient ID: Mikayla Brown, female   DOB: 10-03-1985, 29 y.o.   MRN: [id number]   Subjective:    Patient ID: Mikayla Brown, female    DOB: 08/20/1985, 29 y.o.   MRN: [id number]  HPI  Patient here for her physical.  Has had a history of abnormal pap smears.  Last pap negative with negative HPV.  Had some cervical changes.  Unable to do pelvic secondary to having her period.  Was going to f/u on cervical changes.  Question if related to previous procedure.  She feels she has been doing relatively well.  Handling stress well.  She is concerned regarding her weight being down some.  Has a good appetite.  Bowels ok.  No acid reflux if she avoids foods that aggravate.  No abdominal pain or cramping.  Denies possibility of being pregnant.  Taking her pills regularly.     Past Medical History  Diagnosis Date  . Acne     followed by dermatology    Current Outpatient Prescriptions on File Prior to Visit  Medication Sig Dispense Refill  . doxycycline (ORACEA) 40 MG capsule Take 40 mg by mouth every morning.    . TRI-PREVIFEM 0.18/0.215/0.25 MG-35 MCG tablet Take 1 tablet by mouth  daily 3 Package 3   No current facility-administered medications on file prior to visit.    Review of Systems  Constitutional: Negative for appetite change.       Some minimal decrease in her weight.    HENT: Negative for congestion and sinus pressure.   Respiratory: Negative for cough, chest tightness and shortness of breath.   Cardiovascular: Negative for chest pain, palpitations and leg swelling.  Gastrointestinal: Negative for nausea, vomiting, abdominal pain and diarrhea.  Genitourinary: Negative for dysuria and difficulty urinating.  Musculoskeletal: Negative for joint swelling.  Skin: Negative for color change and rash.  Neurological: Negative for dizziness, light-headedness and headaches.  Psychiatric/Behavioral: Negative for dysphoric mood and agitation.       Objective:    Physical Exam    Constitutional: She is oriented to person, place, and time. She appears well-developed and well-nourished.  HENT:  Nose: Nose normal.  Mouth/Throat: Oropharynx is clear and moist.  Eyes: Right eye exhibits no discharge. Left eye exhibits no discharge. No scleral icterus.  Neck: Neck supple. No thyromegaly present.  Cardiovascular: Normal rate and regular rhythm.   Pulmonary/Chest: Breath sounds normal. No accessory muscle usage. No tachypnea. No respiratory distress. She has no decreased breath sounds. She has no wheezes. She has no rhonchi. Right breast exhibits no inverted nipple, no mass, no nipple discharge and no tenderness (no axillary adenopathy). Left breast exhibits no inverted nipple, no mass, no nipple discharge and no tenderness (no axilarry adenopathy).  Abdominal: Soft. Bowel sounds are normal. There is no tenderness.  Musculoskeletal: She exhibits no edema or tenderness.  Lymphadenopathy:    She has no cervical adenopathy.  Neurological: She is alert and oriented to person, place, and time.  Skin: Skin is warm. No rash noted.  Psychiatric: She has a normal mood and affect. Her behavior is normal.    BP 110/70 mmHg  Pulse 56  Temp(Src) 98.4 F (36.9 C) (Oral)  Ht 5' 3.5" (1.613 m)  Wt 109 lb 2 oz (49.499 kg)  BMI 19.03 kg/m2  SpO2 99%  LMP 06/19/2014 (Exact Date) Wt Readings from Last 3 Encounters:  06/22/14 109 lb 2 oz (49.499 kg)  12/22/13 112 lb 4 oz (50.916 kg)  04/20/13 111 lb 8 oz (  50.576 kg)     Lab Results  Component Value Date   WBC 5.1 06/24/2014   HGB 13.5 06/24/2014   HCT 42 06/24/2014   PLT 267 06/24/2014   GLUCOSE 84 04/29/2013   CHOL 154 06/24/2014   TRIG 50 06/24/2014   HDL 65 06/24/2014   LDLCALC 79 06/24/2014   ALT 8 06/24/2014   AST 15 06/24/2014   NA 140 06/24/2014   K 4.4 06/24/2014   CL 102 04/29/2013   CREATININE 0.7 06/24/2014   BUN 10 06/24/2014   CO2 24 04/29/2013   TSH 1.06 06/24/2014       Assessment & Plan:    Problem List Items Addressed This Visit    Abnormal Pap smear of cervix    Was referred to gyn.  Has a history of abnormal pap smear.  W/u negative per patient.  Repeat pap x 2 negative.  Will repeat pelvic exam at next visit.        Esophagitis - Primary    Has a history of esophagitis.  Treated.  Has been doing well.  Eating well  Does well if avoid foods that aggravate.  Follow.       Health care maintenance    Physical today.  Last PAP 12/22/13 - negative with negative HPV.  F/u pelvic at next visit.        Loss of weight    Weight down a few pounds.  Good appetite.  No acute symptoms.  Recheck soon.  Follow weight.  Check cbc, met c and tsh.        Stress    Feels she is handling things relatively well.  Follow.          I spent 25 minutes with the patient and more than 50% of the time was spent in consultation regarding the above.     Einar Pheasant, MD

## 2014-06-22 NOTE — Progress Notes (Signed)
Pre visit review using our clinic review tool, if applicable. No additional management support is needed unless otherwise documented below in the visit note. 

## 2014-06-24 LAB — LIPID PANEL
Cholesterol: 154 mg/dL (ref 0–200)
HDL: 65 mg/dL (ref 35–70)
LDL Cholesterol: 79 mg/dL
Triglycerides: 50 mg/dL (ref 40–160)

## 2014-06-24 LAB — CBC AND DIFFERENTIAL
HCT: 42 % (ref 36–46)
Hemoglobin: 13.5 g/dL (ref 12.0–16.0)
NEUTROS ABS: 2 /uL
PLATELETS: 267 10*3/uL (ref 150–399)
WBC: 5.1 10*3/mL

## 2014-06-24 LAB — TSH: TSH: 1.06 u[IU]/mL (ref 0.41–5.90)

## 2014-06-24 LAB — HEPATIC FUNCTION PANEL
ALK PHOS: 54 U/L (ref 25–125)
ALT: 8 U/L (ref 7–35)
AST: 15 U/L (ref 13–35)
Bilirubin, Total: 0.9 mg/dL

## 2014-06-24 LAB — BASIC METABOLIC PANEL
BUN: 10 mg/dL (ref 4–21)
Creatinine: 0.7 mg/dL (ref 0.5–1.1)
Glucose: 86 mg/dL
Potassium: 4.4 mmol/L (ref 3.4–5.3)
SODIUM: 140 mmol/L (ref 137–147)

## 2014-06-26 ENCOUNTER — Encounter: Payer: Self-pay | Admitting: Internal Medicine

## 2014-06-30 ENCOUNTER — Telehealth: Payer: Self-pay | Admitting: Internal Medicine

## 2014-06-30 NOTE — Telephone Encounter (Signed)
Pt notified of 06/23/14 lab results via my chart.

## 2014-07-01 ENCOUNTER — Encounter: Payer: Self-pay | Admitting: Internal Medicine

## 2014-07-01 DIAGNOSIS — R634 Abnormal weight loss: Secondary | ICD-10-CM | POA: Insufficient documentation

## 2014-07-01 DIAGNOSIS — Z0000 Encounter for general adult medical examination without abnormal findings: Secondary | ICD-10-CM | POA: Insufficient documentation

## 2014-07-01 NOTE — Assessment & Plan Note (Signed)
Feels she is handling things relatively well.  Follow.   

## 2014-07-01 NOTE — Assessment & Plan Note (Signed)
Has a history of esophagitis.  Treated.  Has been doing well.  Eating well  Does well if avoid foods that aggravate.  Follow.

## 2014-07-01 NOTE — Assessment & Plan Note (Signed)
Was referred to gyn.  Has a history of abnormal pap smear.  W/u negative per patient.  Repeat pap x 2 negative.  Will repeat pelvic exam at next visit.

## 2014-07-01 NOTE — Assessment & Plan Note (Signed)
Physical today.  Last PAP 12/22/13 - negative with negative HPV.  F/u pelvic at next visit.

## 2014-07-01 NOTE — Assessment & Plan Note (Signed)
Weight down a few pounds.  Good appetite.  No acute symptoms.  Recheck soon.  Follow weight.  Check cbc, met c and tsh.

## 2014-07-31 ENCOUNTER — Encounter: Payer: Self-pay | Admitting: Internal Medicine

## 2014-08-21 ENCOUNTER — Other Ambulatory Visit (HOSPITAL_COMMUNITY)
Admission: RE | Admit: 2014-08-21 | Discharge: 2014-08-21 | Disposition: A | Discharge status: Home / Self Care | Payer: 59 | Source: Ambulatory Visit | Attending: Internal Medicine | Admitting: Internal Medicine

## 2014-08-21 ENCOUNTER — Ambulatory Visit (INDEPENDENT_AMBULATORY_CARE_PROVIDER_SITE_OTHER): Payer: 59 | Admitting: Internal Medicine

## 2014-08-21 ENCOUNTER — Encounter: Payer: Self-pay | Admitting: Internal Medicine

## 2014-08-21 VITALS — BP 97/67 | HR 80 | Temp 98.3°F | Ht 64.0 in | Wt 105.1 lb

## 2014-08-21 DIAGNOSIS — R634 Abnormal weight loss: Secondary | ICD-10-CM

## 2014-08-21 DIAGNOSIS — R87619 Unspecified abnormal cytological findings in specimens from cervix uteri: Secondary | ICD-10-CM

## 2014-08-21 DIAGNOSIS — Z0000 Encounter for general adult medical examination without abnormal findings: Secondary | ICD-10-CM | POA: Diagnosis not present

## 2014-08-21 DIAGNOSIS — Z01411 Encounter for gynecological examination (general) (routine) with abnormal findings: Secondary | ICD-10-CM | POA: Diagnosis present

## 2014-08-21 DIAGNOSIS — K209 Esophagitis, unspecified without bleeding: Secondary | ICD-10-CM

## 2014-08-21 DIAGNOSIS — F439 Reaction to severe stress, unspecified: Secondary | ICD-10-CM

## 2014-08-21 DIAGNOSIS — R87810 Cervical high risk human papillomavirus (HPV) DNA test positive: Secondary | ICD-10-CM | POA: Diagnosis present

## 2014-08-21 DIAGNOSIS — Z658 Other specified problems related to psychosocial circumstances: Secondary | ICD-10-CM

## 2014-08-21 DIAGNOSIS — Z1151 Encounter for screening for human papillomavirus (HPV): Secondary | ICD-10-CM | POA: Insufficient documentation

## 2014-08-21 DIAGNOSIS — Z124 Encounter for screening for malignant neoplasm of cervix: Secondary | ICD-10-CM

## 2014-08-21 LAB — HM PAP SMEAR

## 2014-08-21 NOTE — Progress Notes (Signed)
Patient ID: Aurelio JewCatherine Nill, female   DOB: December 04, 1985, 29 y.o.   MRN: [id number]   Subjective:    Patient ID: Aurelio JewCatherine Symanski, female    DOB: December 04, 1985, 29 y.o.   MRN: [id number]  HPI  Patient here to follow up on these issues and for a pelvic/pap.  Also here to f/u on her weight loss.  Weight is down more.  She states her lowest was 103.5.  Up to 105 pounds now.  Feels related to increased stress.  Her fiancee/roommate - quit his job.  Some increased stress with his family.  Increased stress at work.  We discussed possible treatment for her stress and anxiety.  She is feeling more anxious.  No increased acid reflux.  No bowel change.  She has questions about pregnancy.  Wants to try to get pregnant.     Past Medical History  Diagnosis Date  . Acne     followed by dermatology    Current Outpatient Prescriptions on File Prior to Visit  Medication Sig Dispense Refill  . doxycycline (ORACEA) 40 MG capsule Take 40 mg by mouth every morning.    . TRI-PREVIFEM 0.18/0.215/0.25 MG-35 MCG tablet Take 1 tablet by mouth  daily 3 Package 3   No current facility-administered medications on file prior to visit.    Review of Systems  Constitutional:       Has lost weight.  Feels related to increased stress and anxiety.  Trying to drink protein shakes now.  Discussed eating regular meals.    HENT: Negative for congestion and sinus pressure.   Respiratory: Negative for cough, chest tightness and shortness of breath.   Cardiovascular: Negative for chest pain, palpitations and leg swelling.  Gastrointestinal: Negative for nausea, vomiting, abdominal pain and diarrhea.  Genitourinary:       Having her period now.  Finishing.  No vaginal itching or discharge.   Skin: Negative for color change and rash.  Neurological: Negative for dizziness, light-headedness and headaches.  Hematological: Negative for adenopathy. Does not bruise/bleed easily.  Psychiatric/Behavioral: Negative for dysphoric mood.    Increased stress and anxiety as outlined.         Objective:    Physical Exam  Constitutional: No distress.  HENT:  Nose: Nose normal.  Mouth/Throat: Oropharynx is clear and moist.  Neck: Neck supple. No thyromegaly present.  Cardiovascular: Normal rate and regular rhythm.   Pulmonary/Chest: Breath sounds normal. No respiratory distress. She has no wheezes.  Abdominal: Soft. Bowel sounds are normal. There is no tenderness.  Genitourinary:  Normal external genitalia.  Vaginal vault without lesions.  Cervix identified.  PAP performed.  Could not appreciate any adnexal masses or tenderness.    Musculoskeletal: She exhibits no edema or tenderness.  Lymphadenopathy:    She has no cervical adenopathy.  Skin: No rash noted. No erythema.  Psychiatric: She has a normal mood and affect. Her behavior is normal.    BP 97/67 mmHg  Pulse 80  Temp(Src) 98.3 F (36.8 C) (Oral)  Ht 5\' 4"  (1.626 m)  Wt 105 lb 2 oz (47.684 kg)  BMI 18.04 kg/m2  SpO2 100%  LMP 08/14/2014 (Exact Date) Wt Readings from Last 3 Encounters:  08/21/14 105 lb 2 oz (47.684 kg)  06/22/14 109 lb 2 oz (49.499 kg)  12/22/13 112 lb 4 oz (50.916 kg)     Lab Results  Component Value Date   WBC 5.1 06/24/2014   HGB 13.5 06/24/2014   HCT 42 06/24/2014   PLT 267 06/24/2014  GLUCOSE 84 04/29/2013   CHOL 154 06/24/2014   TRIG 50 06/24/2014   HDL 65 06/24/2014   LDLCALC 79 06/24/2014   ALT 8 06/24/2014   AST 15 06/24/2014   NA 140 06/24/2014   K 4.4 06/24/2014   CL 102 04/29/2013   CREATININE 0.7 06/24/2014   BUN 10 06/24/2014   CO2 24 04/29/2013   TSH 1.06 06/24/2014       Assessment & Plan:   Problem List Items Addressed This Visit    Abnormal Pap smear of cervix    Repeat pap today.  Refer to gyn if HPV persistent.       Esophagitis    No upper symptoms reported.  Follow.       Health care maintenance    Physical last visit.  Pelvic/pap 08/21/14.        Loss of weight    Persistent.   Discussed the increased stress.  No other physical symptoms.  Drinking supplemental shakes.  Hold on medication for stress.  Follow closely.  She has stopped her birth control pills.  We discussed waiting for pregnancy.        Stress    Increased stress with her job and home situation.  Discussed treatment and counseling.  She will think about this.  Follow.        Other Visit Diagnoses    Pap smear for cervical cancer screening    -  Primary    Relevant Orders    Cytology - PAP (Completed)      I spent 25 minutes with the patient and more than 50% of the time was spent in consultation regarding the above.     Dale DurhamSCOTT, Demario Faniel, MD

## 2014-08-21 NOTE — Progress Notes (Signed)
Pre visit review using our clinic review tool, if applicable. No additional management support is needed unless otherwise documented below in the visit note. 

## 2014-08-23 LAB — CYTOLOGY - PAP

## 2014-08-24 ENCOUNTER — Encounter: Payer: Self-pay | Admitting: Internal Medicine

## 2014-08-24 DIAGNOSIS — IMO0002 Reserved for concepts with insufficient information to code with codable children: Secondary | ICD-10-CM

## 2014-08-24 DIAGNOSIS — R87619 Unspecified abnormal cytological findings in specimens from cervix uteri: Secondary | ICD-10-CM

## 2014-08-25 NOTE — Telephone Encounter (Signed)
Order placed for gyn referral.  

## 2014-08-27 ENCOUNTER — Encounter: Payer: Self-pay | Admitting: Internal Medicine

## 2014-08-27 NOTE — Assessment & Plan Note (Signed)
Increased stress with her job and home situation.  Discussed treatment and counseling.  She will think about this.  Follow.

## 2014-08-27 NOTE — Assessment & Plan Note (Signed)
No upper symptoms reported.  Follow.   

## 2014-08-27 NOTE — Assessment & Plan Note (Signed)
Repeat pap today.  Refer to gyn if HPV persistent.

## 2014-08-27 NOTE — Assessment & Plan Note (Signed)
Physical last visit.  Pelvic/pap 08/21/14.

## 2014-08-27 NOTE — Assessment & Plan Note (Addendum)
Persistent.  Discussed the increased stress.  No other physical symptoms.  Drinking supplemental shakes.  Hold on medication for stress.  Follow closely.  She has stopped her birth control pills.  We discussed waiting for pregnancy.

## 2014-09-14 ENCOUNTER — Telehealth: Payer: Self-pay

## 2014-09-14 NOTE — Telephone Encounter (Signed)
cx bx show cin 1. Per mad needs repeat colpo in 6 months.

## 2014-09-15 NOTE — Telephone Encounter (Signed)
PT AWARE. APPT FOR COLPO ON 03/13/2015. CM

## 2014-09-15 NOTE — Telephone Encounter (Signed)
Lmtrc. cm

## 2014-10-10 ENCOUNTER — Encounter: Payer: Self-pay | Admitting: Internal Medicine

## 2014-10-10 ENCOUNTER — Ambulatory Visit (INDEPENDENT_AMBULATORY_CARE_PROVIDER_SITE_OTHER): Payer: 59 | Admitting: Internal Medicine

## 2014-10-10 VITALS — BP 100/70 | HR 71 | Temp 99.0°F | Ht 64.0 in | Wt 112.2 lb

## 2014-10-10 DIAGNOSIS — Z319 Encounter for procreative management, unspecified: Secondary | ICD-10-CM

## 2014-10-10 DIAGNOSIS — Z658 Other specified problems related to psychosocial circumstances: Secondary | ICD-10-CM | POA: Diagnosis not present

## 2014-10-10 DIAGNOSIS — R634 Abnormal weight loss: Secondary | ICD-10-CM

## 2014-10-10 DIAGNOSIS — Z0000 Encounter for general adult medical examination without abnormal findings: Secondary | ICD-10-CM

## 2014-10-10 DIAGNOSIS — F439 Reaction to severe stress, unspecified: Secondary | ICD-10-CM

## 2014-10-10 DIAGNOSIS — R87619 Unspecified abnormal cytological findings in specimens from cervix uteri: Secondary | ICD-10-CM

## 2014-10-10 NOTE — Assessment & Plan Note (Signed)
Physical 08/21/14.  PAP 08/21/14 - positive HPV.  Referred to gyn.  Seeing gyn.

## 2014-10-10 NOTE — Assessment & Plan Note (Signed)
Persistent positive HPV.  Referred to gyn.  Saw Dr Greggory KeeneFrancesco.  Is s/p biopsy.  Has f/u planned in 6 months.

## 2014-10-10 NOTE — Assessment & Plan Note (Signed)
Better.  Does not feel she needs any further intervention.  Follow.

## 2014-10-10 NOTE — Assessment & Plan Note (Signed)
Eating better.  Gaining weight.  Doing better.

## 2014-10-10 NOTE — Progress Notes (Signed)
Patient ID: Mona Ayars, female   DOB: 1985/06/17, 29 y.o.   MRN: [id number]   Subjective:    Patient ID: Alisah Grandberry, female    DOB: 1985/06/24, 21 y.o.   MRN: [id number]  HPI  Patient here for a scheduled follow up.  Here to follow up regarding her weight.  Weight loss previously.  See last note.  Has been eating well.  Stress is better.  Work is better.  She is working overtime.  This is helping her financial concerns.  Weight is up.  No acid reflux.  Bowels better.  Overall feeling better.  Saw gyn.  Is s/p biopsy.  Has f/u planned in 6 months.     Past Medical History  Diagnosis Date  . Acne     followed by dermatology    Current Outpatient Prescriptions on File Prior to Visit  Medication Sig Dispense Refill  . doxycycline (ORACEA) 40 MG capsule Take 40 mg by mouth every morning.    Brynda Greathouse 1 % CREA      No current facility-administered medications on file prior to visit.    Review of Systems  Constitutional:       Appetite is better.  Weight is better.    HENT: Negative for congestion and sinus pressure.   Respiratory: Negative for cough and shortness of breath.   Gastrointestinal: Negative for nausea, vomiting, abdominal pain and diarrhea.       Bowels better.   Neurological: Negative for dizziness, light-headedness and headaches.  Psychiatric/Behavioral: Negative for dysphoric mood and agitation.       Objective:    Physical Exam  Constitutional: She appears well-developed and well-nourished. No distress.  HENT:  Nose: Nose normal.  Mouth/Throat: Oropharynx is clear and moist.  Neck: Neck supple. No thyromegaly present.  Cardiovascular: Normal rate and regular rhythm.   Pulmonary/Chest: Breath sounds normal. No respiratory distress. She has no wheezes.  Abdominal: Soft. Bowel sounds are normal. There is no tenderness.  Musculoskeletal: She exhibits no edema or tenderness.  Lymphadenopathy:    She has no cervical adenopathy.  Skin: No rash noted.  No erythema.  Psychiatric: She has a normal mood and affect. Her behavior is normal.    BP 100/70 mmHg  Pulse 71  Temp(Src) 99 F (37.2 C) (Oral)  Ht  (1.626 m)  Wt 112 lb 4 oz (50.916 kg)  BMI 19.26 kg/m2  SpO2 99%  LMP 09/15/2014 (Exact Date) Wt Readings from Last 3 Encounters:  10/10/14 112 lb 4 oz (50.916 kg)  08/21/14 105 lb 2 oz (47.684 kg)  06/22/14 109 lb 2 oz (49.499 kg)     Lab Results  Component Value Date   WBC 5.1 06/24/2014   HGB 13.5 06/24/2014   HCT 42 06/24/2014   PLT 267 06/24/2014   GLUCOSE 84 04/29/2013   CHOL 154 06/24/2014   TRIG 50 06/24/2014   HDL 65 06/24/2014   LDLCALC 79 06/24/2014   ALT 8 06/24/2014   AST 15 06/24/2014   NA 140 06/24/2014   K 4.4 06/24/2014   CL 102 04/29/2013   CREATININE 0.7 06/24/2014   BUN 10 06/24/2014   CO2 24 04/29/2013   TSH 1.06 06/24/2014       Assessment & Plan:   Problem List Items Addressed This Visit    Abnormal Pap smear of cervix - Primary    Persistent positive HPV.  Referred to gyn.  Saw Dr Greggory Keen.  Is s/p biopsy.  Has f/u planned in 6  months.        Desire for pregnancy    She has stopped her ocp's.  Not actively trying at this time.  Discussed starting pre natal vitamins.  Instructed her to d/w gyn regarding replacing doxycycline with a different abx.  Follow.       Health care maintenance    Physical 08/21/14.  PAP 08/21/14 - positive HPV.  Referred to gyn.  Seeing gyn.       Loss of weight    Eating better.  Gaining weight.  Doing better.        Stress    Better.  Does not feel she needs any further intervention.  Follow.           Dale DurhamSCOTT, Cuthbert Turton, MD

## 2014-10-10 NOTE — Progress Notes (Signed)
Pre visit review using our clinic review tool, if applicable. No additional management support is needed unless otherwise documented below in the visit note. 

## 2014-10-10 NOTE — Assessment & Plan Note (Signed)
She has stopped her ocp's.  Not actively trying at this time.  Discussed starting pre natal vitamins.  Instructed her to d/w gyn regarding replacing doxycycline with a different abx.  Follow.

## 2014-12-11 ENCOUNTER — Other Ambulatory Visit: Payer: Self-pay | Admitting: Internal Medicine

## 2014-12-11 NOTE — Telephone Encounter (Signed)
Last OV 6.28.16.  Please advise refill 

## 2014-12-11 NOTE — Telephone Encounter (Signed)
Pt had informed me that she had stopped.  I am ok to refill, but need to clarify she needs the medication and confirm no concern about pregnancy.  Thanks.

## 2014-12-11 NOTE — Telephone Encounter (Signed)
Left message on VM to return call 

## 2014-12-13 NOTE — Telephone Encounter (Signed)
Please try pt again.  If she is wanting to restart - ok to refill.  Let me know if any problems.

## 2015-01-11 ENCOUNTER — Other Ambulatory Visit: Payer: Self-pay | Admitting: Internal Medicine

## 2015-01-11 ENCOUNTER — Encounter: Payer: Self-pay | Admitting: Internal Medicine

## 2015-01-11 ENCOUNTER — Ambulatory Visit (INDEPENDENT_AMBULATORY_CARE_PROVIDER_SITE_OTHER): Payer: 59 | Admitting: Internal Medicine

## 2015-01-11 VITALS — BP 90/60 | HR 67 | Temp 97.9°F | Resp 18 | Ht 64.0 in | Wt 125.5 lb

## 2015-01-11 DIAGNOSIS — Z319 Encounter for procreative management, unspecified: Secondary | ICD-10-CM

## 2015-01-11 DIAGNOSIS — R634 Abnormal weight loss: Secondary | ICD-10-CM

## 2015-01-11 DIAGNOSIS — Z658 Other specified problems related to psychosocial circumstances: Secondary | ICD-10-CM

## 2015-01-11 DIAGNOSIS — F439 Reaction to severe stress, unspecified: Secondary | ICD-10-CM

## 2015-01-11 DIAGNOSIS — R87619 Unspecified abnormal cytological findings in specimens from cervix uteri: Secondary | ICD-10-CM

## 2015-01-11 DIAGNOSIS — K209 Esophagitis, unspecified without bleeding: Secondary | ICD-10-CM

## 2015-01-11 NOTE — Progress Notes (Signed)
Patient ID: Nivia Gervase, female   DOB: [birth year], 29 y.o.   MRN: [id number]   Subjective:    Patient ID: Leroy Trim, female    DOB: 1985/10/22, 29 y.o.   MRN: [id number]  HPI  Patient with past history of abnormal pap smear and recent weight loss.  Here today to f/u on these issues.  She is doing better.  Eating.  Stress is better.  No nausea or vomiting.  No bowel change.  No acid reflux.  Overall feels better.  Still off ocp's.  Discussed again need for pre natal vitamins and stopping doxycycline if not needed.  Weight is up.  She feels better.     Past Medical History  Diagnosis Date  . Acne     followed by dermatology   No past surgical history on file. Family History  Problem Relation Age of Onset  . Thyroid disease Mother   . Breast cancer Maternal Grandmother   . Colon cancer Neg Hx    Social History   Social History  . Marital Status: Single    Spouse Name: N/A  . Number of Children: 0  . Years of Education: N/A   Occupational History  .  Lab [employer]   Social History Main Topics  . Smoking status: Never Smoker   . Smokeless tobacco: Never Used  . Alcohol Use: 0.0 oz/week    0 Standard drinks or equivalent per week     Comment: occasional  . Drug Use: No  . Sexual Activity: Not Asked   Other Topics Concern  . None   Social History Narrative    Outpatient Encounter Prescriptions as of 01/11/2015  Medication Sig  . doxycycline (ORACEA) 40 MG capsule Take 40 mg by mouth every morning.  Brynda Greathouse 1 % CREA    No facility-administered encounter medications on file as of 01/11/2015.    Review of Systems  Constitutional: Negative for appetite change and unexpected weight change.  HENT: Negative for congestion and sinus pressure.   Respiratory: Negative for cough, chest tightness and shortness of breath.   Cardiovascular: Negative for chest pain, palpitations and leg swelling.  Gastrointestinal: Negative for nausea, vomiting, abdominal pain and  diarrhea.  Genitourinary: Negative for dysuria and difficulty urinating.  Neurological: Negative for dizziness, light-headedness and headaches.  Psychiatric/Behavioral: Negative for dysphoric mood and agitation.       Objective:     Blood pressure rechecked by me:  106/72  Physical Exam  Constitutional: She appears well-developed and well-nourished. No distress.  HENT:  Nose: Nose normal.  Mouth/Throat: Oropharynx is clear and moist.  Neck: Neck supple. No thyromegaly present.  Cardiovascular: Normal rate and regular rhythm.   Pulmonary/Chest: Breath sounds normal. No respiratory distress. She has no wheezes.  Abdominal: Soft. Bowel sounds are normal. There is no tenderness.  Musculoskeletal: She exhibits no edema or tenderness.  Lymphadenopathy:    She has no cervical adenopathy.  Skin: No rash noted. No erythema.  Psychiatric: She has a normal mood and affect. Her behavior is normal.    BP 90/60 mmHg  Pulse 67  Temp(Src) 97.9 F (36.6 C) (Oral)  Resp 18  Ht  (1.626 m)  Wt 125 lb 8 oz (56.926 kg)  BMI 21.53 kg/m2  SpO2 99%  LMP 11/20/2014 (Exact Date) Wt Readings from Last 3 Encounters:  01/11/15 125 lb 8 oz (56.926 kg)  10/10/14 112 lb 4 oz (50.916 kg)  08/21/14 105 lb 2 oz (47.684 kg)     Lab  Results  Component Value Date   WBC 5.1 06/24/2014   HGB 13.5 06/24/2014   HCT 42 06/24/2014   PLT 267 06/24/2014   GLUCOSE 84 04/29/2013   CHOL 154 06/24/2014   TRIG 50 06/24/2014   HDL 65 06/24/2014   LDLCALC 79 06/24/2014   ALT 8 06/24/2014   AST 15 06/24/2014   NA 140 06/24/2014   K 4.4 06/24/2014   CL 102 04/29/2013   CREATININE 0.7 06/24/2014   BUN 10 06/24/2014   CO2 24 04/29/2013   TSH 1.06 06/24/2014       Assessment & Plan:   Problem List Items Addressed This Visit    Abnormal Pap smear of cervix    Persistent positive HPV.  Referred to gyn.  Saw Dr Greggory Keen.  S/p biopsy.  Recommended f/u in 6 months.  Continue f/u with gyn.  Has f/u  planned in 02/2015.       Desire for pregnancy    LMP 11/2014.  Of ocp's.  Has a history of irregular periods before started ocp's.  Check quantitative pregnancy test.        Esophagitis - Primary    No upper symptoms.  Follow.  On no medication.       Loss of weight    Weight is up.  Feels better.  Follow.       Stress    Better.  Follow.            Dale Downs, MD

## 2015-01-11 NOTE — Progress Notes (Signed)
Pre-visit discussion using our clinic review tool. No additional management support is needed unless otherwise documented below in the visit note.  

## 2015-01-12 LAB — BETA HCG QUANT (REF LAB): hCG Quant: <1 m[IU]/mL

## 2015-01-13 ENCOUNTER — Encounter: Payer: Self-pay | Admitting: Internal Medicine

## 2015-01-15 ENCOUNTER — Encounter: Payer: Self-pay | Admitting: Internal Medicine

## 2015-01-15 NOTE — Assessment & Plan Note (Signed)
Weight is up.  Feels better.  Follow.

## 2015-01-15 NOTE — Assessment & Plan Note (Signed)
No upper symptoms.  Follow.  On no medication.

## 2015-01-15 NOTE — Assessment & Plan Note (Signed)
Better.  Follow.  

## 2015-01-15 NOTE — Assessment & Plan Note (Signed)
LMP 11/2014.  Of ocp's.  Has a history of irregular periods before started ocp's.  Check quantitative pregnancy test.

## 2015-01-15 NOTE — Assessment & Plan Note (Addendum)
Persistent positive HPV.  Referred to gyn.  Saw Dr Greggory Keen.  S/p biopsy.  Recommended f/u in 6 months.  Continue f/u with gyn.  Has f/u planned in 02/2015.

## 2015-03-13 ENCOUNTER — Encounter: Payer: Self-pay | Admitting: Obstetrics and Gynecology

## 2015-03-13 ENCOUNTER — Ambulatory Visit (INDEPENDENT_AMBULATORY_CARE_PROVIDER_SITE_OTHER): Payer: 59 | Admitting: Obstetrics and Gynecology

## 2015-03-13 VITALS — BP 88/57 | HR 69 | Ht 64.0 in | Wt 120.7 lb

## 2015-03-13 DIAGNOSIS — N870 Mild cervical dysplasia: Secondary | ICD-10-CM

## 2015-03-13 DIAGNOSIS — N879 Dysplasia of cervix uteri, unspecified: Secondary | ICD-10-CM

## 2015-03-13 NOTE — Progress Notes (Signed)
Patient ID: Mikayla Brown, female   DOB: 1986-01-19, 29 y.o.   MRN: [id number]   Chief complaint: 1.  History of cervical dysplasia. 2.  Here for an interval colposcopy  6 month colpo hpv pos pap cx bx 08/2014- cin1 Nonsmoker Monogamous, engaged  OBJECTIVE: BP 88/57 mmHg  Pulse 69  Ht 5\' 4"  (1.626 m)  Wt 120 lb 11.2 oz (54.749 kg)  BMI 20.71 kg/m2  LMP 02/13/2015 Pelvic: External genitalia-normal BUS-normal. Vagina-normal. Cervix-post-LEEP cone biopsy changes present; no gross lesions  Colposcopy: SCJ is not completely visualized: Squamous metaplasia versus white epithelium extends into canal. No ectocervical lesions are seen. Upper vagina is normal.  ASSESSMENT: 1.  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 3.  Inadequate colposcopy today with inability to see the SCJ completely.  PLAN: 1.  ECC. 2.  Return in 6 months for repeat colposcopy  Herold HarmsMartin A Defrancesco, MD  Note: This dictation was prepared with Dragon dictation along with smaller phrase technology. Any transcriptional errors that result from this process are unintentional.

## 2015-03-13 NOTE — Patient Instructions (Signed)
1.  Findings today showed subtle abnormal changes within the endocervical canal and an ECC was performed (scraping). 2.  Return in 6 months for follow-up colposcopy. 3.  We will contact you regarding results when available From this test today.

## 2015-03-16 ENCOUNTER — Encounter: Payer: Self-pay | Admitting: Obstetrics and Gynecology

## 2015-03-16 LAB — PATHOLOGY

## 2015-05-14 ENCOUNTER — Ambulatory Visit: Payer: 59 | Admitting: Internal Medicine

## 2015-08-23 ENCOUNTER — Ambulatory Visit (INDEPENDENT_AMBULATORY_CARE_PROVIDER_SITE_OTHER): Payer: 59 | Admitting: Internal Medicine

## 2015-08-23 ENCOUNTER — Encounter: Payer: Self-pay | Admitting: Internal Medicine

## 2015-08-23 VITALS — BP 100/60 | HR 88 | Temp 98.6°F | Resp 18 | Ht 64.0 in | Wt 111.5 lb

## 2015-08-23 DIAGNOSIS — R634 Abnormal weight loss: Secondary | ICD-10-CM | POA: Diagnosis not present

## 2015-08-23 DIAGNOSIS — Z319 Encounter for procreative management, unspecified: Secondary | ICD-10-CM | POA: Diagnosis not present

## 2015-08-23 DIAGNOSIS — Z658 Other specified problems related to psychosocial circumstances: Secondary | ICD-10-CM

## 2015-08-23 DIAGNOSIS — F439 Reaction to severe stress, unspecified: Secondary | ICD-10-CM

## 2015-08-23 DIAGNOSIS — Z0000 Encounter for general adult medical examination without abnormal findings: Secondary | ICD-10-CM

## 2015-08-23 NOTE — Progress Notes (Addendum)
Patient ID: Mikayla Brown, female   DOB: 1985-08-09, 30 y.o.   MRN: [id number]   Subjective:    Patient ID: Mikayla Brown, female    DOB: [birth year], 30 y.o.   MRN: [id number]  HPI  Patient here for her physical exam.  She has lost weight.  Started running 03/2015 and 04/2015.  She was eating, but was doing a lot of physical activity.  Also reports increased stress with her current relationship.  Discussed at length with her today.  No nausea now.  No abdominal pain or cramping.  Bowels moving.  Periods more regular.  She denies the possibility of being pregnant, but may be contemplating pregnancy in the near future.  LMP 08/03/15 - 08/09/15.  She has been eating regularly now.  Eating healthier.  No nausea or vomiting now.  Feels needs something to help with increased anxiety and stretch.     Past Medical History  Diagnosis Date  . Acne     followed by dermatology  . Acne   . Weight loss   . Esophagitis   . Stress   . GERD (gastroesophageal reflux disease)   . Heavy menses   . Painful menstruation   . Rosacea   . HPV test positive   . Anxiety    Past Surgical History  Procedure Laterality Date  . Leep     Family History  Problem Relation Age of Onset  . Thyroid disease Mother   . Breast cancer Maternal Grandmother   . Colon cancer Neg Hx   . Diabetes Neg Hx   . Heart disease Neg Hx   . Ovarian cancer Neg Hx    Social History   Social History  . Marital Status: Single    Spouse Name: N/A  . Number of Children: 0  . Years of Education: N/A   Occupational History  .  Lab [employer]   Social History Main Topics  . Smoking status: Never Smoker   . Smokeless tobacco: Never Used  . Alcohol Use: 0.0 oz/week    0 Standard drinks or equivalent per week     Comment: occasional  . Drug Use: Yes    Special: Marijuana     Comment: occas  . Sexual Activity: Yes    Birth Control/ Protection: None   Other Topics Concern  . None   Social History Narrative    Outpatient  Encounter Prescriptions as of 08/23/2015  Medication Sig  . doxycycline (ORACEA) 40 MG capsule Take 40 mg by mouth every morning.  . Multiple Vitamin (MULTIVITAMIN) tablet Take 1 tablet by mouth daily.  . prednisoLONE acetate (PRED FORTE) 1 % ophthalmic suspension Place 1 drop into the left eye 4 (four) times daily.   No facility-administered encounter medications on file as of 08/23/2015.    Review of Systems  Constitutional: Negative for appetite change.       Weight down.  Eating better now.  Eating healthier.   HENT: Negative for congestion and sinus pressure.   Eyes: Negative for pain and visual disturbance.  Respiratory: Negative for cough, chest tightness and shortness of breath.   Cardiovascular: Negative for chest pain, palpitations and leg swelling.  Gastrointestinal: Negative for nausea, vomiting, abdominal pain and diarrhea.  Genitourinary: Negative for dysuria and difficulty urinating.  Musculoskeletal: Negative for back pain and joint swelling.  Skin: Negative for color change and rash.  Neurological: Negative for dizziness, light-headedness and headaches.  Hematological: Negative for adenopathy. Does not bruise/bleed easily.  Psychiatric/Behavioral: Negative for dysphoric  mood and agitation.       Objective:    Physical Exam  Constitutional: She is oriented to person, place, and time. She appears well-developed and well-nourished. No distress.  HENT:  Nose: Nose normal.  Mouth/Throat: Oropharynx is clear and moist.  Eyes: Right eye exhibits no discharge. Left eye exhibits no discharge. No scleral icterus.  Neck: Neck supple. No thyromegaly present.  Cardiovascular: Normal rate and regular rhythm.   Pulmonary/Chest: Breath sounds normal. No accessory muscle usage. No tachypnea. No respiratory distress. She has no decreased breath sounds. She has no wheezes. She has no rhonchi. Right breast exhibits no inverted nipple, no mass, no nipple discharge and no tenderness (no  axillary adenopathy). Left breast exhibits no inverted nipple, no mass, no nipple discharge and no tenderness (no axilarry adenopathy).  Abdominal: Soft. Bowel sounds are normal. There is no tenderness.  Musculoskeletal: She exhibits no edema or tenderness.  Lymphadenopathy:    She has no cervical adenopathy.  Neurological: She is alert and oriented to person, place, and time.  Skin: Skin is warm. No rash noted. No erythema.  Psychiatric: She has a normal mood and affect. Her behavior is normal.    BP 100/60 mmHg  Pulse 88  Temp(Src) 98.6 F (37 C) (Oral)  Resp 18  Ht 5' 4"  (1.626 m)  Wt 111 lb 8 oz (50.576 kg)  BMI 19.13 kg/m2  SpO2 98%  LMP 08/03/2015 (Exact Date) Wt Readings from Last 3 Encounters:  08/23/15 111 lb 8 oz (50.576 kg)  03/13/15 120 lb 11.2 oz (54.749 kg)  01/11/15 125 lb 8 oz (56.926 kg)     Lab Results  Component Value Date   WBC 5.1 06/24/2014   HGB 13.5 06/24/2014   HCT 42 06/24/2014   PLT 267 06/24/2014   GLUCOSE 84 04/29/2013   CHOL 154 06/24/2014   TRIG 50 06/24/2014   HDL 65 06/24/2014   LDLCALC 79 06/24/2014   ALT 8 06/24/2014   AST 15 06/24/2014   NA 140 06/24/2014   K 4.4 06/24/2014   CL 102 04/29/2013   CREATININE 0.7 06/24/2014   BUN 10 06/24/2014   CO2 24 04/29/2013   TSH 1.06 06/24/2014       Assessment & Plan:   Problem List Items Addressed This Visit    Desire for pregnancy    See above.  D/w Dr Enzo Bi.        Health care maintenance    Physical today 08/23/15.  PAP being followed through gyn.  Planning for f/u colposcopy per note.        Loss of weight    Weight is back down.  Has been more active.  Is eating healthier.  Check cbc, met c and tsh.  Follow.       Stress - Primary    Increased stress as outlined.  Discussed with her today.  Discussed treatment.  She does feel needs something to help level things out.  Discussed pregnancy possibility in near future.  Will d/w Dr Enzo Bi.          I have  placed a call to Dr Enzo Bi.    Einar Pheasant, MD

## 2015-08-23 NOTE — Progress Notes (Signed)
Pre-visit discussion using our clinic review tool. No additional management support is needed unless otherwise documented below in the visit note.  

## 2015-08-24 ENCOUNTER — Other Ambulatory Visit: Payer: Self-pay | Admitting: Internal Medicine

## 2015-08-25 LAB — COMPREHENSIVE METABOLIC PANEL
A/G RATIO: 1.8 (ref 1.2–2.2)
ALBUMIN: 4.7 g/dL (ref 3.5–5.5)
ALK PHOS: 75 IU/L (ref 39–117)
ALT: 12 IU/L (ref 0–32)
AST: 12 IU/L (ref 0–40)
BILIRUBIN TOTAL: 0.8 mg/dL (ref 0.0–1.2)
BUN/Creatinine Ratio: 13 (ref 9–23)
BUN: 8 mg/dL (ref 6–20)
CHLORIDE: 102 mmol/L (ref 96–106)
CO2: 23 mmol/L (ref 18–29)
Calcium: 9.5 mg/dL (ref 8.7–10.2)
Creatinine, Ser: 0.63 mg/dL (ref 0.57–1.00)
GFR calc non Af Amer: 121 mL/min/{1.73_m2} (ref >59)
GFR, EST AFRICAN AMERICAN: 139 mL/min/{1.73_m2} (ref >59)
GLOBULIN, TOTAL: 2.6 g/dL (ref 1.5–4.5)
Glucose: 96 mg/dL (ref 65–99)
POTASSIUM: 4.4 mmol/L (ref 3.5–5.2)
SODIUM: 142 mmol/L (ref 134–144)
TOTAL PROTEIN: 7.3 g/dL (ref 6.0–8.5)

## 2015-08-25 LAB — LIPID PANEL W/O CHOL/HDL RATIO
Cholesterol, Total: 143 mg/dL (ref 100–199)
HDL: 77 mg/dL (ref >39)
LDL Calculated: 58 mg/dL (ref 0–99)
Triglycerides: 38 mg/dL (ref 0–149)
VLDL Cholesterol Cal: 8 mg/dL (ref 5–40)

## 2015-08-25 LAB — TSH: TSH: 0.63 u[IU]/mL (ref 0.450–4.500)

## 2015-08-25 LAB — CBC WITH DIFFERENTIAL/PLATELET
BASOS ABS: 0 10*3/uL (ref 0.0–0.2)
BASOS: 0 %
EOS (ABSOLUTE): 0.1 10*3/uL (ref 0.0–0.4)
Eos: 1 %
HEMATOCRIT: 39.3 % (ref 34.0–46.6)
HEMOGLOBIN: 13.2 g/dL (ref 11.1–15.9)
IMMATURE GRANS (ABS): 0 10*3/uL (ref 0.0–0.1)
Immature Granulocytes: 0 %
LYMPHS ABS: 2 10*3/uL (ref 0.7–3.1)
LYMPHS: 27 %
MCH: 28.9 pg (ref 26.6–33.0)
MCHC: 33.6 g/dL (ref 31.5–35.7)
MCV: 86 fL (ref 79–97)
MONOCYTES: 8 %
Monocytes Absolute: 0.6 10*3/uL (ref 0.1–0.9)
NEUTROS ABS: 4.7 10*3/uL (ref 1.4–7.0)
Neutrophils: 64 %
Platelets: 287 10*3/uL (ref 150–379)
RBC: 4.56 x10E6/uL (ref 3.77–5.28)
RDW: 12.3 % (ref 12.3–15.4)
WBC: 7.4 10*3/uL (ref 3.4–10.8)

## 2015-08-25 LAB — HEPATIC FUNCTION PANEL: Bilirubin, Direct: 0.19 mg/dL (ref 0.00–0.40)

## 2015-08-26 ENCOUNTER — Encounter: Payer: Self-pay | Admitting: Internal Medicine

## 2015-08-26 NOTE — Assessment & Plan Note (Signed)
Physical today 08/23/15.  PAP being followed through gyn.  Planning for f/u colposcopy per note.

## 2015-08-26 NOTE — Assessment & Plan Note (Signed)
Increased stress as outlined.  Discussed with her today.  Discussed treatment.  She does feel needs something to help level things out.  Discussed pregnancy possibility in near future.  Will d/w Dr Greggory KeeneFrancesco.

## 2015-08-26 NOTE — Assessment & Plan Note (Signed)
Weight is back down.  Has been more active.  Is eating healthier.  Check cbc, met c and tsh.  Follow.  

## 2015-08-26 NOTE — Assessment & Plan Note (Signed)
See above.  D/w Dr Greggory KeeneFrancesco.

## 2015-08-28 ENCOUNTER — Encounter: Payer: Self-pay | Admitting: Internal Medicine

## 2015-08-31 NOTE — Addendum Note (Signed)
Addended by: Charm BargesSCOTT, Harbor Vanover S on: 08/31/2015 05:43 AM   Modules accepted: Kipp BroodSmartSet

## 2015-09-06 ENCOUNTER — Encounter: Payer: 59 | Admitting: Obstetrics and Gynecology

## 2015-09-10 ENCOUNTER — Telehealth: Payer: Self-pay | Admitting: Internal Medicine

## 2015-09-10 NOTE — Telephone Encounter (Signed)
Pt notified OB/GYN recommended starting zoloft.  Called pt to discuss starting.  Left message.  Also sent her a my chart message.

## 2015-09-11 NOTE — Telephone Encounter (Addendum)
Patient will be at [phone], she can be reached at this number today.

## 2015-09-12 MED ORDER — SERTRALINE HCL 50 MG PO TABS: 50.0000 mg | ORAL_TABLET | Freq: Every day | ORAL | Status: DC

## 2015-09-12 NOTE — Telephone Encounter (Signed)
Order sent in for zoloft.  Discussed with Dr Greggory KeeneFrancesco. Also discussed with pt.  rx called in.

## 2015-09-12 NOTE — Addendum Note (Signed)
Addended by: Charm BargesSCOTT, Rashonda Warrior S on: 09/12/2015 12:08 PM   Modules accepted: Orders

## 2015-09-19 ENCOUNTER — Telehealth: Payer: Self-pay | Admitting: *Deleted

## 2015-09-19 ENCOUNTER — Ambulatory Visit (INDEPENDENT_AMBULATORY_CARE_PROVIDER_SITE_OTHER): Payer: 59 | Admitting: Obstetrics and Gynecology

## 2015-09-19 ENCOUNTER — Encounter: Payer: Self-pay | Admitting: Obstetrics and Gynecology

## 2015-09-19 VITALS — BP 100/71 | HR 72 | Ht 64.0 in | Wt 111.7 lb

## 2015-09-19 DIAGNOSIS — F419 Anxiety disorder, unspecified: Secondary | ICD-10-CM | POA: Diagnosis not present

## 2015-09-19 DIAGNOSIS — N870 Mild cervical dysplasia: Secondary | ICD-10-CM

## 2015-09-19 NOTE — Progress Notes (Signed)
Chief complaint: 1.  Colposcopy for history of cervical dysplasia. 2.  Anxiety.  Abnormal Pap smear history: hpv pos pap cx bx 08/2014- cin1 Nonsmoker Monogamous, engaged  03/13/2015: Colposcopy: SCJ is not completely visualized: Squamous metaplasia versus white epithelium extends into canal. No ectocervical lesions are seen. Upper vagina is normal. ECC negative  Patient reports having exacerbation of anxiety since starting Zoloft 25 mg a day.  Over this past week.  She feels that with medication, she is functionally more compromised.  I recommended that she discontinue the medication now and make referral to psychiatry with Dr. Tamsen RoersArtie Kapur; Dr. Maryruth BunKapur will be better able to optimize medication management for her anxiety while she is attempting to conceive.  OBJECTIVE: BP 100/71 mmHg  Pulse 72  Ht 5\' 4"  (1.626 m)  Wt 111 lb 11.2 oz (50.667 kg)  BMI 19.16 kg/m2  LMP 09/05/2015 Pleasant white female in no acute distress.  Alert and oriented.  Affect is appropriate. Pelvic exam: External genitalia-normal BUS-normal. Vagina-no lesions. Cervix-no gross lesions. Uterus-not examined. Adnexa-examined. Rectovaginal-normal.  External exam  PROCEDURE: Colposcopy of upper adjacent vagina and cervix with ECC. Squamocolumnar junction is visualized. Abnormal lesions: 360 area of squamous metaplasia versus acetowhite epithelium at the squamocolumnar junction. Tests:  Pap smear.  ECC  ASSESSMENT: 1.  History of CIN-1. 2.  Adequate colposcopy today with squamous metaplasia versus AWE At the squamocolumnar junction 3.  Anxiety, exacerbated with Zoloft therapy, wanting to conceive.  PLAN: 1.  Laparoscopy with Pap smear and ECC as noted. 2.  Discontinue Zoloft. 3.  Referral to psychiatry for consultation regarding medications  For anxiety while attempting to conceive. 4.  Return in 6 months for Pap smear/colposcopy  A total of 15 minutes were spent face-to-face with the patient during  this encounter and over half of that time dealt with counseling and coordination of care.  Mikayla HarmsMartin A Tyner Codner, MD  Note: This dictation was prepared with Dragon dictation along with smaller phrase technology. Any transcriptional errors that result from this process are unintentional.

## 2015-09-19 NOTE — Telephone Encounter (Signed)
Can we change this medication or does patient need to come for appointment

## 2015-09-19 NOTE — Telephone Encounter (Signed)
Patient stated that she discontinued her Zoloft due to side effect of drowsiness, nausea, dizziness, paranoid and elevated heart rate after Anxiety triggers.  She requested to have a change in medication.   Pt contact [phone]

## 2015-09-19 NOTE — Telephone Encounter (Signed)
Have her stop the medication and see if symptoms improve.  Call with update.

## 2015-09-19 NOTE — Patient Instructions (Signed)
1. Pap smear and ECC are done today 2. Return in 6 months for repeat colposcopy 3. Discontinue Zoloft 4. Referral to Dr. Arvilla MarketArti Kapur  psychiatry for consultation regarding anxiety

## 2015-09-21 LAB — PATHOLOGY

## 2015-09-21 LAB — PAP IG W/ RFLX HPV ASCU: PAP Smear Comment: 0

## 2015-09-24 NOTE — Telephone Encounter (Signed)
Left message for patient to return call.

## 2015-09-28 ENCOUNTER — Telehealth: Payer: Self-pay | Admitting: *Deleted

## 2015-09-28 NOTE — Telephone Encounter (Signed)
Spoke with the patient, she did stop the Zoloft and within 48 hours all her symptoms resolved.  She does want to be put on something but wants to hold off until she gets back from a vacation to [place] next week.  Please advise. thanks

## 2015-09-28 NOTE — Telephone Encounter (Signed)
Left a message to return my call. thanks 

## 2015-09-28 NOTE — Telephone Encounter (Signed)
Have her call when she returns.  May need to set up appt to discuss.

## 2015-09-28 NOTE — Telephone Encounter (Signed)
Patient stated that she missed a call from the office in regards to her Zoloft medication possibly.  Pt contact [phone]

## 2015-10-01 NOTE — Telephone Encounter (Signed)
Left a VM to schedule an appt when she returns.

## 2015-11-05 ENCOUNTER — Telehealth: Payer: Self-pay | Admitting: Internal Medicine

## 2015-11-05 ENCOUNTER — Encounter: Payer: Self-pay | Admitting: Internal Medicine

## 2015-11-05 ENCOUNTER — Ambulatory Visit (INDEPENDENT_AMBULATORY_CARE_PROVIDER_SITE_OTHER): Payer: 59 | Admitting: Internal Medicine

## 2015-11-05 DIAGNOSIS — R87619 Unspecified abnormal cytological findings in specimens from cervix uteri: Secondary | ICD-10-CM

## 2015-11-05 DIAGNOSIS — Z319 Encounter for procreative management, unspecified: Secondary | ICD-10-CM | POA: Diagnosis not present

## 2015-11-05 DIAGNOSIS — F439 Reaction to severe stress, unspecified: Secondary | ICD-10-CM

## 2015-11-05 DIAGNOSIS — Z658 Other specified problems related to psychosocial circumstances: Secondary | ICD-10-CM

## 2015-11-05 DIAGNOSIS — N879 Dysplasia of cervix uteri, unspecified: Secondary | ICD-10-CM

## 2015-11-05 DIAGNOSIS — R634 Abnormal weight loss: Secondary | ICD-10-CM

## 2015-11-05 NOTE — Telephone Encounter (Signed)
My chart message sent to pt to keep me posted on follow up.

## 2015-11-05 NOTE — Progress Notes (Signed)
Patient ID: Mikayla Brown, female   DOB: 12-24-1985, 30 y.o.   MRN: [id number]   Subjective:    Patient ID: Mikayla Brown, female    DOB: 1985-09-04, 30 y.o.   MRN: [id number]  HPI  Patient here for a scheduled follow up.  She was having issues with increased anxiety.  See last note.  Had started her on zoloft after talking with gyn.  She started the medication, but it made her more anxious.  She stopped after several days.  Felt better after starting.  She was frustrated today because of playing phone tag here and gyn office for not scheduling the psych referral.  We discussed this at length today.  Discussed psych referral.  She wants to hold at this time.  Feels work is better.  Feels things are better with her relationship.  She is eating.  No nausea or vomiting.  No acid reflux.  Bowels moving.     Past Medical History:  Diagnosis Date  . Acne    followed by dermatology  . Acne   . Anxiety   . Esophagitis   . GERD (gastroesophageal reflux disease)   . Heavy menses   . HPV test positive   . Painful menstruation   . Rosacea   . Stress   . Weight loss    Past Surgical History:  Procedure Laterality Date  . EYE SURGERY    . LEEP     Family History  Problem Relation Age of Onset  . Thyroid disease Mother   . Breast cancer Maternal Grandmother   . Colon cancer Neg Hx   . Diabetes Neg Hx   . Heart disease Neg Hx   . Ovarian cancer Neg Hx    Social History   Social History  . Marital status: Single    Spouse name: N/A  . Number of children: 0  . Years of education: N/A   Occupational History  .  Lab [employer]   Social History Main Topics  . Smoking status: Never Smoker  . Smokeless tobacco: Never Used  . Alcohol use 0.0 oz/week     Comment: occasional  . Drug use:     Types: Marijuana     Comment: occas  . Sexual activity: Yes    Birth control/ protection: None   Other Topics Concern  . None   Social History Narrative  . None    Outpatient Encounter  Prescriptions as of 11/05/2015  Medication Sig  . doxycycline (ORACEA) 40 MG capsule Take 40 mg by mouth every morning.  . Multiple Vitamin (MULTIVITAMIN) tablet Take 1 tablet by mouth daily.  . sertraline (ZOLOFT) 50 MG tablet Take 1 tablet (50 mg total) by mouth daily.  . [DISCONTINUED] prednisoLONE acetate (PRED FORTE) 1 % ophthalmic suspension Place 1 drop into the left eye 4 (four) times daily.   No facility-administered encounter medications on file as of 11/05/2015.     Review of Systems  Constitutional: Negative for appetite change.       Weight increased a couple of pounds from the previous check.    HENT: Negative for congestion and sinus pressure.   Respiratory: Negative for cough, chest tightness and shortness of breath.   Cardiovascular: Negative for chest pain, palpitations and leg swelling.  Gastrointestinal: Negative for abdominal pain, diarrhea, nausea and vomiting.  Genitourinary: Negative for difficulty urinating and dysuria.  Musculoskeletal: Negative for back pain and joint swelling.  Skin: Negative for color change and rash.  Neurological: Negative for dizziness,  light-headedness and headaches.  Psychiatric/Behavioral: Negative for dysphoric mood.       Increased frustration as outlined.  Anxiety some better.         Objective:    Physical Exam  Constitutional: She appears well-developed and well-nourished. No distress.  HENT:  Nose: Nose normal.  Mouth/Throat: Oropharynx is clear and moist.  Neck: Neck supple. No thyromegaly present.  Cardiovascular: Normal rate and regular rhythm.   Pulmonary/Chest: Breath sounds normal. No respiratory distress. She has no wheezes.  Abdominal: Soft. Bowel sounds are normal. There is no tenderness.  Musculoskeletal: She exhibits no edema or tenderness.  Lymphadenopathy:    She has no cervical adenopathy.  Skin: No rash noted. No erythema.  Psychiatric: She has a normal mood and affect. Her behavior is normal.    BP  100/60 (BP Location: Left Arm, Patient Position: Sitting)   Pulse 76   Ht  (1.626 m)   Wt 113 lb (51.3 kg)   SpO2 98%   BMI 19.40 kg/m  Wt Readings from Last 3 Encounters:  11/05/15 113 lb (51.3 kg)  09/19/15 111 lb 11.2 oz (50.7 kg)  08/23/15 111 lb 8 oz (50.6 kg)     Lab Results  Component Value Date   WBC 7.4 08/24/2015   HGB 13.5 06/24/2014   HCT 39.3 08/24/2015   PLT 287 08/24/2015   GLUCOSE 96 08/24/2015   CHOL 143 08/24/2015   TRIG 38 08/24/2015   HDL 77 08/24/2015   LDLCALC 58 08/24/2015   ALT 12 08/24/2015   AST 12 08/24/2015   NA 142 08/24/2015   K 4.4 08/24/2015   CL 102 08/24/2015   CREATININE 0.63 08/24/2015   BUN 8 08/24/2015   CO2 23 08/24/2015   TSH 0.630 08/24/2015       Assessment & Plan:   Problem List Items Addressed This Visit    Abnormal Pap smear of cervix    Recently saw gyn.  See note.  Has f/u planned in 03/2016.        Cervical dysplasia    Seeing gyn.       Desire for pregnancy    Seeing gyn.  Actively trying to get pregnant.  Have discussed with her regarding prenatal vitamins.        Loss of weight    Weight up a couple of pounds from the previous check.  Eating.  Asymptomatic - no GI symptoms.  Follow.        Stress    She feels is some better.  Discussed her frustration at length today.  She wants to hold on medication or psych referral at this time.  Will notify me if changes her mind.         Other Visit Diagnoses   None.      Dale [place], MD

## 2015-11-05 NOTE — Assessment & Plan Note (Signed)
She feels is some better.  Discussed her frustration at length today.  She wants to hold on medication or psych referral at this time.  Will notify me if changes her mind.

## 2015-11-05 NOTE — Assessment & Plan Note (Signed)
Seeing gyn.  Actively trying to get pregnant.  Have discussed with her regarding prenatal vitamins.

## 2015-11-05 NOTE — Assessment & Plan Note (Signed)
Seeing gyn.  

## 2015-11-05 NOTE — Assessment & Plan Note (Signed)
Recently saw gyn.  See note.  Has f/u planned in 03/2016.

## 2015-11-05 NOTE — Assessment & Plan Note (Signed)
Weight up a couple of pounds from the previous check.  Eating.  Asymptomatic - no GI symptoms.  Follow.

## 2015-11-09 ENCOUNTER — Other Ambulatory Visit: Payer: Self-pay | Admitting: Internal Medicine

## 2015-11-09 NOTE — Telephone Encounter (Signed)
LOV 11/05/2015. Allene Dillon, CMA

## 2016-02-05 ENCOUNTER — Encounter: Payer: 59 | Admitting: Internal Medicine

## 2016-03-20 ENCOUNTER — Encounter: Payer: Self-pay | Admitting: Obstetrics and Gynecology

## 2016-03-20 ENCOUNTER — Ambulatory Visit (INDEPENDENT_AMBULATORY_CARE_PROVIDER_SITE_OTHER): Payer: 59 | Admitting: Obstetrics and Gynecology

## 2016-03-20 VITALS — BP 115/68 | HR 94 | Ht 64.0 in | Wt 113.8 lb

## 2016-03-20 DIAGNOSIS — N870 Mild cervical dysplasia: Secondary | ICD-10-CM | POA: Diagnosis not present

## 2016-03-20 DIAGNOSIS — Z3169 Encounter for other general counseling and advice on procreation: Secondary | ICD-10-CM

## 2016-03-20 DIAGNOSIS — Z9889 Other specified postprocedural states: Secondary | ICD-10-CM | POA: Diagnosis not present

## 2016-03-20 LAB — POCT URINE PREGNANCY: PREG TEST UR: NEGATIVE

## 2016-03-20 NOTE — Progress Notes (Signed)
Chief complaint: 1. Colposcopy 2. History of cervical dysplasia; nonsmoker 3. Preconception planning  Patient is currently off of birth control and is attempting conception. She is having regular cycles. Optimizing conception was reviewed with regards to timing of intercourse. Should she be unsuccessful through 12 months of trying (with history of regular cycles), I recommend she return for primary infertility workup  Pap smear history: History of cervical dysplasia; status post LEEP cone biopsy ([facility]) 12/22/2013 Pap smear-negative/negative  08/21/2014 Pap smear-negative/POSITIVE high risk HPV 09/12/2014 colposcopic directed biopsies-CIN-1 03/14/2015 ECC-negative; colposcopy inadequate, squamous metaplasia versus white epithelium extending into the canal 09/19/2015 ECC negative; Pap smear-negative; colposcopy adequate, 360 of squamous metaplasia versus acetowhite epithelium at SCJ  OBJECTIVE: BP 115/68   Pulse 94   Ht 5\' 4"  (1.626 m)   Wt 113 lb 12.8 oz (51.6 kg)   LMP 02/16/2016 (Exact Date)   BMI 19.53 kg/m  Colposcopy: Indications: History of CIN-1, status post LEEP cone biopsy Findings: Squamous metaplasia versus acetowhite epithelium at squamocolumnar junction; no ectocervical lesions; cervix has been swept appearance Biopsies:  Pap smear co-test DESCRIPTION: Patient was placed in the dorsal lithotomy position. A Graves' speculum was placed in the vagina. The cervix and vagina is swabbed with acetic acid Fox swabs. Colposcopy is performed with the above-noted findings. No biopsies are taken. Pap smear is obtained.   ASSESSMENT: 1. History of cervical dysplasia; status post LEEP cone biopsy in past 2. 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 Normal interval colposcopy findings today; previous colposcopy biopsies were negative 4. Preconception discussion completed  PLAN: 1. Pap smear co-test 2. Continue taking prenatal vitamins 3. Return in 6 months for  colposcopy if Pap smear remains abnormal; otherwise, return to normal yearly Pap smear testing  A total of 15 minutes were spent face-to-face with the patient during this encounter and over half of that time dealt with counseling and coordination of care.  Herold HarmsMartin A Saurav Crumble, MD  Note: This dictation was prepared with Dragon dictation along with smaller phrase technology. Any transcriptional errors that result from this process are unintentional.

## 2016-03-20 NOTE — Addendum Note (Signed)
Addended by: Marchelle FolksMILLER, Kallista Pae G on: 03/20/2016 11:29 AM   Modules accepted: Orders

## 2016-03-20 NOTE — Patient Instructions (Signed)
1. Pap smear is done today 2. 6 month colposcopy appointment as scheduled. 3. If Pap smear is normal, we will return to yearly testing with Pap smears. 4. If no success with pregnancy attempt at 12 months, consider returning for infertility workup

## 2016-03-25 LAB — PAP IG AND HPV HIGH-RISK
HPV, high-risk: NEGATIVE
PAP Smear Comment: 0

## 2016-08-21 ENCOUNTER — Ambulatory Visit (INDEPENDENT_AMBULATORY_CARE_PROVIDER_SITE_OTHER): Payer: 59 | Admitting: Internal Medicine

## 2016-08-21 ENCOUNTER — Encounter: Payer: Self-pay | Admitting: Internal Medicine

## 2016-08-21 VITALS — BP 114/66 | HR 71 | Temp 98.7°F | Resp 12 | Ht 64.0 in | Wt 114.8 lb

## 2016-08-21 DIAGNOSIS — R634 Abnormal weight loss: Secondary | ICD-10-CM | POA: Diagnosis not present

## 2016-08-21 DIAGNOSIS — Z13220 Encounter for screening for lipoid disorders: Secondary | ICD-10-CM | POA: Diagnosis not present

## 2016-08-21 DIAGNOSIS — R87619 Unspecified abnormal cytological findings in specimens from cervix uteri: Secondary | ICD-10-CM

## 2016-08-21 DIAGNOSIS — Z3169 Encounter for other general counseling and advice on procreation: Secondary | ICD-10-CM

## 2016-08-21 DIAGNOSIS — Z0000 Encounter for general adult medical examination without abnormal findings: Secondary | ICD-10-CM

## 2016-08-21 DIAGNOSIS — F439 Reaction to severe stress, unspecified: Secondary | ICD-10-CM

## 2016-08-21 NOTE — Progress Notes (Signed)
Pre-visit discussion using our clinic review tool. No additional management support is needed unless otherwise documented below in the visit note.  

## 2016-08-21 NOTE — Assessment & Plan Note (Signed)
Physical today 08/21/16.  PAP being followed by gyn.

## 2016-08-21 NOTE — Progress Notes (Signed)
Patient ID: Mikayla Brown, female   DOB: [birth year], 31 y.o.   MRN: [id number]   Subjective:    Patient ID: Mikayla Brown, female    DOB: [birth year], 31 y.o.   MRN: [id number]  HPI  Patient here for her physical exam.  She reports some increased stress.  Her and her boyfriend have been trying to conceive.  States have been trying for 2 years - unsuccessful.  States her boyfriend has stopped trying now.  Increased stress related to this.  Discussed with her today.  She feels overall she is handling things relatively well.  Does not feel needs anything more at this time.  She is eating well.  No nausea or vomiting.  Bowels moving.  She has been seeing gyn.  States everything was clear on last check.  Has f/u next month.     Past Medical History:  Diagnosis Date  . Acne    followed by dermatology  . Acne   . Anxiety   . Esophagitis   . GERD (gastroesophageal reflux disease)   . Heavy menses   . HPV test positive   . Painful menstruation   . Rosacea   . Stress   . Weight loss    Past Surgical History:  Procedure Laterality Date  . EYE SURGERY    . LEEP     Family History  Problem Relation Age of Onset  . Thyroid disease Mother   . Breast cancer Maternal Grandmother   . Colon cancer Neg Hx   . Diabetes Neg Hx   . Heart disease Neg Hx   . Ovarian cancer Neg Hx    Social History   Social History  . Marital status: Single    Spouse name: N/A  . Number of children: 0  . Years of education: N/A   Occupational History  .  Lab [employer]   Social History Main Topics  . Smoking status: Never Smoker  . Smokeless tobacco: Never Used  . Alcohol use 0.0 oz/week     Comment: occasional  . Drug use: Yes    Types: Marijuana     Comment: occas  . Sexual activity: Yes    Birth control/ protection: None   Other Topics Concern  . None   Social History Narrative  . None    Outpatient Encounter Prescriptions as of 08/21/2016  Medication Sig  . Multiple Vitamin (MULTIVITAMIN)  tablet Take 1 tablet by mouth daily.   No facility-administered encounter medications on file as of 08/21/2016.     Review of Systems  Constitutional: Negative for appetite change and unexpected weight change.  HENT: Negative for congestion and sinus pressure.   Eyes: Negative for pain and visual disturbance.  Respiratory: Negative for cough, chest tightness and shortness of breath.   Cardiovascular: Negative for chest pain, palpitations and leg swelling.  Gastrointestinal: Negative for abdominal pain, diarrhea, nausea and vomiting.  Genitourinary: Negative for difficulty urinating and dysuria.  Musculoskeletal: Negative for back pain and joint swelling.  Skin: Negative for color change and rash.  Neurological: Negative for dizziness, light-headedness and headaches.  Hematological: Negative for adenopathy. Does not bruise/bleed easily.  Psychiatric/Behavioral: Negative for agitation and dysphoric mood.       Increased stress as outlined.         Objective:    Physical Exam  Constitutional: She is oriented to person, place, and time. She appears well-developed and well-nourished. No distress.  HENT:  Nose: Nose normal.  Mouth/Throat: Oropharynx is clear and moist.  Eyes: Right eye exhibits no discharge. Left eye exhibits no discharge. No scleral icterus.  Neck: Neck supple. No thyromegaly present.  Cardiovascular: Normal rate and regular rhythm.   Pulmonary/Chest: Breath sounds normal. No accessory muscle usage. No tachypnea. No respiratory distress. She has no decreased breath sounds. She has no wheezes. She has no rhonchi. Right breast exhibits no inverted nipple, no mass, no nipple discharge and no tenderness (no axillary adenopathy). Left breast exhibits no inverted nipple, no mass, no nipple discharge and no tenderness (no axilarry adenopathy).  Abdominal: Soft. Bowel sounds are normal. There is no tenderness.  Musculoskeletal: She exhibits no edema or tenderness.    Lymphadenopathy:    She has no cervical adenopathy.  Neurological: She is alert and oriented to person, place, and time.  Skin: Skin is warm. No rash noted. No erythema.  Psychiatric: She has a normal mood and affect. Her behavior is normal.    BP 114/66 (BP Location: Left Arm, Patient Position: Sitting, Cuff Size: Normal)   Pulse 71   Temp 98.7 F (37.1 C) (Oral)   Resp 12   Ht 5\' 4"  (1.626 m)   Wt 114 lb 12.8 oz (52.1 kg)   SpO2 100%   BMI 19.71 kg/m  Wt Readings from Last 3 Encounters:  08/21/16 114 lb 12.8 oz (52.1 kg)  03/20/16 113 lb 12.8 oz (51.6 kg)  11/05/15 113 lb (51.3 kg)     Lab Results  Component Value Date   WBC 8.5 08/21/2016   HGB 13.5 06/24/2014   HCT 42.5 08/21/2016   PLT 260 08/21/2016   GLUCOSE 81 08/21/2016   CHOL 140 08/21/2016   TRIG 40 08/21/2016   HDL 61 08/21/2016   LDLCALC 71 08/21/2016   ALT 9 08/21/2016   AST 12 08/21/2016   NA 140 08/21/2016   K 4.1 08/21/2016   CL 100 08/21/2016   CREATININE 0.64 08/21/2016   BUN 8 08/21/2016   CO2 24 08/21/2016   TSH 0.681 08/21/2016       Assessment & Plan:   Problem List Items Addressed This Visit    Abnormal Pap smear of cervix    Recently saw gyn.  States everything was clear on last check.  Has f/u next month.        Encounter for preconception consultation    Discussed wit her today.  She plans to f/u with gyn.        Health care maintenance    Physical today 08/21/16.  PAP being followed by gyn.        Loss of weight - Primary    Weight stable.  Follow.        Relevant Orders   CBC with Differential/Platelet (Completed)   Comprehensive metabolic panel (Completed)   TSH (Completed)   Stress    Increased stress as outlined.  Discussed with her today.  Does not feel needs anything more at this time.  Follow.         Other Visit Diagnoses    Screening cholesterol level       Relevant Orders   Lipid panel (Completed)       Dale [place], MD

## 2016-08-22 LAB — CBC WITH DIFFERENTIAL/PLATELET
BASOS ABS: 0 10*3/uL (ref 0.0–0.2)
Basos: 0 %
EOS (ABSOLUTE): 0.1 10*3/uL (ref 0.0–0.4)
EOS: 1 %
HEMATOCRIT: 42.5 % (ref 34.0–46.6)
HEMOGLOBIN: 14.2 g/dL (ref 11.1–15.9)
IMMATURE GRANS (ABS): 0 10*3/uL (ref 0.0–0.1)
IMMATURE GRANULOCYTES: 0 %
LYMPHS ABS: 2.7 10*3/uL (ref 0.7–3.1)
LYMPHS: 32 %
MCH: 29 pg (ref 26.6–33.0)
MCHC: 33.4 g/dL (ref 31.5–35.7)
MCV: 87 fL (ref 79–97)
MONOCYTES: 6 %
Monocytes Absolute: 0.5 10*3/uL (ref 0.1–0.9)
Neutrophils Absolute: 5.1 10*3/uL (ref 1.4–7.0)
Neutrophils: 61 %
Platelets: 260 10*3/uL (ref 150–379)
RBC: 4.9 x10E6/uL (ref 3.77–5.28)
RDW: 12.8 % (ref 12.3–15.4)
WBC: 8.5 10*3/uL (ref 3.4–10.8)

## 2016-08-22 LAB — COMPREHENSIVE METABOLIC PANEL
ALBUMIN: 4.4 g/dL (ref 3.5–5.5)
ALK PHOS: 55 IU/L (ref 39–117)
ALT: 9 IU/L (ref 0–32)
AST: 12 IU/L (ref 0–40)
Albumin/Globulin Ratio: 1.5 (ref 1.2–2.2)
BUN / CREAT RATIO: 13 (ref 9–23)
BUN: 8 mg/dL (ref 6–20)
Bilirubin Total: 0.7 mg/dL (ref 0.0–1.2)
CALCIUM: 9.6 mg/dL (ref 8.7–10.2)
CHLORIDE: 100 mmol/L (ref 96–106)
CO2: 24 mmol/L (ref 18–29)
CREATININE: 0.64 mg/dL (ref 0.57–1.00)
GFR, EST AFRICAN AMERICAN: 138 mL/min/{1.73_m2} (ref >59)
GFR, EST NON AFRICAN AMERICAN: 119 mL/min/{1.73_m2} (ref >59)
GLOBULIN, TOTAL: 3 g/dL (ref 1.5–4.5)
GLUCOSE: 81 mg/dL (ref 65–99)
Potassium: 4.1 mmol/L (ref 3.5–5.2)
Sodium: 140 mmol/L (ref 134–144)
TOTAL PROTEIN: 7.4 g/dL (ref 6.0–8.5)

## 2016-08-22 LAB — LIPID PANEL
Chol/HDL Ratio: 2.3 ratio (ref 0.0–4.4)
Cholesterol, Total: 140 mg/dL (ref 100–199)
HDL: 61 mg/dL (ref >39)
LDL Calculated: 71 mg/dL (ref 0–99)
Triglycerides: 40 mg/dL (ref 0–149)
VLDL Cholesterol Cal: 8 mg/dL (ref 5–40)

## 2016-08-22 LAB — TSH: TSH: 0.681 u[IU]/mL (ref 0.450–4.500)

## 2016-08-23 ENCOUNTER — Encounter: Payer: Self-pay | Admitting: Internal Medicine

## 2016-08-23 NOTE — Assessment & Plan Note (Signed)
Recently saw gyn.  States everything was clear on last check.  Has f/u next month.

## 2016-08-23 NOTE — Assessment & Plan Note (Signed)
Increased stress as outlined.  Discussed with her today.  Does not feel needs anything more at this time.  Follow.   

## 2016-08-23 NOTE — Assessment & Plan Note (Signed)
Weight stable.  Follow.  

## 2016-08-23 NOTE — Assessment & Plan Note (Signed)
Discussed wit her today.  She plans to f/u with gyn.

## 2016-09-18 ENCOUNTER — Encounter: Payer: Self-pay | Admitting: Obstetrics and Gynecology

## 2016-09-18 ENCOUNTER — Ambulatory Visit (INDEPENDENT_AMBULATORY_CARE_PROVIDER_SITE_OTHER): Payer: 59 | Admitting: Obstetrics and Gynecology

## 2016-09-18 VITALS — BP 104/71 | HR 86 | Ht 64.0 in | Wt 114.0 lb

## 2016-09-18 DIAGNOSIS — N879 Dysplasia of cervix uteri, unspecified: Secondary | ICD-10-CM | POA: Diagnosis not present

## 2016-09-18 DIAGNOSIS — N979 Female infertility, unspecified: Secondary | ICD-10-CM

## 2016-09-18 DIAGNOSIS — Z9889 Other specified postprocedural states: Secondary | ICD-10-CM | POA: Diagnosis not present

## 2016-09-18 LAB — POCT URINE PREGNANCY: Preg Test, Ur: NEGATIVE

## 2016-09-18 NOTE — Addendum Note (Signed)
Addended by: Darol DestineMILLER, [place]isure on: 09/18/2016 01:36 PM   Modules accepted: Orders

## 2016-09-18 NOTE — Progress Notes (Signed)
Chief complaint: 1. Colposcopy 2. History of cervical dysplasia; status post LEEP cone biopsy ([facility])  Contraception-none Currently attempting pregnancy.  Pap smear history: History of cervical dysplasia; status post LEEP cone biopsy ([facility]) 12/22/2013 Pap smear-negative/negative  08/21/2014 Pap smear-negative/POSITIVE high risk HPV 09/12/2014 colposcopic directed biopsies-CIN-1 03/14/2015 ECC-negative; colposcopy inadequate, squamous metaplasia versus white epithelium extending into the canal 09/19/2015 ECC negative; Pap smear-negative; colposcopy adequate, 360 of squamous metaplasia versus acetowhite epithelium at SCJ 03/20/2016 Pap smear/HPV negative/negative; Colposcopy Findings: Squamous metaplasia versus acetowhite epithelium at squamocolumnar junction; no ectocervical lesions; cervix has windswept appearance   OBJECTIVE: BP 104/71   Pulse 86   Ht 5\' 4"  (1.626 m)   Wt 114 lb (51.7 kg)   LMP 09/09/2016 (Exact Date)   BMI 19.57 kg/m  Pleasant female in no acute distress. Alert and oriented. Pelvic exam: External genitalia-normal BUS-normal Vagina-normal Cervix-no gross lesions  PROCEDURE: Colposcopy of cervix and upper adjacent vagina without biopsies Indications: History of CIN-1: Status post LEEP cone biopsy Findings: SCJ is visualized; cervix with windswept appearance, without abnormal lesions Biopsies: Pap smear, chest Description: Patient was placed in the dorsal lithotomy position. A Graves' speculum was placed in the vagina. The cervix and vagina is swabbed with acetic acid Fox swabs. Colposcopy is performed with the above-noted findings. No biopsies are taken. Pap smear/HPV is obtained.  ASSESSMENT: 1. History of cervical dysplasia; status post LEEP cone biopsy in past 2. Last Pap/HPV negative/negative 3. Normal interval colposcopy findings today; previous colposcopy biopsies were negative 4. Infertility workup discussed; questions  answered  PLAN: 1. Pap smear co-Test 2. Continue taking prenatal vitamins 3. Return in 1 year for Pap smear 4. Recommend semen analysis; following female factor workup, return for infertility workup.  A total of 15 minutes were spent face-to-face with the patient during this encounter and over half of that time dealt with counseling and coordination of care.  Herold HarmsMartin A Defrancesco, MD  Note: This dictation was prepared with Dragon dictation along with smaller phrase technology. Any transcriptional errors that result from this process are unintentional.

## 2016-09-18 NOTE — Patient Instructions (Signed)
1. Colposcopy with Pap smear is obtained today 2. Repeat Pap smear in 1 year unless abnormality is identified 3. Recommend semen analysis as first part of fertility workup. Once this is accomplished, scheduled follow-up appointment.

## 2016-09-22 LAB — PAP IG AND HPV HIGH-RISK
HPV, HIGH-RISK: NEGATIVE
PAP Smear Comment: 0

## 2016-10-21 ENCOUNTER — Encounter: Payer: Self-pay | Admitting: Obstetrics and Gynecology

## 2016-10-21 ENCOUNTER — Ambulatory Visit (INDEPENDENT_AMBULATORY_CARE_PROVIDER_SITE_OTHER): Payer: 59 | Admitting: Obstetrics and Gynecology

## 2016-10-21 VITALS — BP 119/79 | HR 91 | Ht 64.0 in | Wt 112.9 lb

## 2016-10-21 DIAGNOSIS — Z87410 Personal history of cervical dysplasia: Secondary | ICD-10-CM | POA: Insufficient documentation

## 2016-10-21 DIAGNOSIS — F1290 Cannabis use, unspecified, uncomplicated: Secondary | ICD-10-CM

## 2016-10-21 DIAGNOSIS — Z9889 Other specified postprocedural states: Secondary | ICD-10-CM

## 2016-10-21 DIAGNOSIS — Z3201 Encounter for pregnancy test, result positive: Secondary | ICD-10-CM | POA: Diagnosis not present

## 2016-10-21 LAB — POCT URINE PREGNANCY: Preg Test, Ur: POSITIVE — AB

## 2016-10-21 NOTE — Progress Notes (Signed)
   GYNECOLOGY CLINIC PROGRESS NOTE  Subjective:    Mikayla Brown is a 31 y.o. G1P0 female who presents for evaluation of amenorrhea. She believes she could be pregnant. Pregnancy is desired. Has been trying for 2 years.  Was previously undergoing workup for infertility when she conceived spontaneously. Sexual Activity: single partner, contraception: none. Current symptoms also include: nausea and positive home pregnancy test. Last period was normal. Patient's last menstrual period was 09/02/2016.   The following portions of the patient's history were reviewed and updated as appropriate:   She  has a past medical history of Acne; Acne; Anxiety; Esophagitis; GERD (gastroesophageal reflux disease); Heavy menses; HPV test positive; Painful menstruation; Rosacea; Stress; and Weight loss.   She  has a past surgical history that includes LEEP and Eye surgery.   Her family history includes Breast cancer in her maternal grandmother; Thyroid disease in her mother.   She  reports that she has never smoked. She has never used smokeless tobacco. She reports that she drinks alcohol. She reports that she uses drugs, including Marijuana.   She has a current medication list which includes the following prescription(s): multivitamin-prenatal.   She is allergic to penicillins and sulfa antibiotics.  Review of Systems Pertinent items noted in HPI and remainder of comprehensive ROS otherwise negative.     Objective:    BP 119/79 (BP Location: Left Arm, Patient Position: Sitting, Cuff Size: Normal)   Pulse 91   Ht 5\' 4"  (1.626 m)   Wt 112 lb 14.4 oz (51.2 kg)   LMP 09/02/2016   BMI 19.38 kg/m  General: alert, no distress and no acute distress    Lab Review Urine HCG: positive    Assessment:    Absence of menstruation.    H/o LEEP (5 years ago) H/o cervical dysplasia H/o marijuana use   Plan:   - Pregnancy Test: Positive: EDC: 06/09/2017, with EDD 7.0 weeks. Briefly discussed pre-natal care  options. Pregnancy,  Encouraged well-balanced diet, plenty of rest when needed, pre-natal vitamins daily and walking for exercise. Discussed self-help for nausea, avoiding OTC medications until consulting provider or pharmacist, other than Tylenol as needed, minimal caffeine (1-2 cups daily) and avoiding alcohol. She will schedule her initial OB visit in the next month. Feel free to call with any questions.   - History marijuana use, is cutting down.  Encouraged cessation. Notes that she still occasionally uses due to nausea from the pregnancy.  Given samples of Bonjesta, to call if effective and will submit prescription.  - H/o cervical dysplasia and prior LEEP.  Discussed post-surgical effects of LEEP on pregnancy (small chance of cervical incompetency or cervical stenosis).  Most recent 3 pap smears normal.  Patient should not require any further screening x 1 year.    A total of 15 minutes were spent face-to-face with the patient during this encounter and over half of that time dealt with counseling and coordination of care.    Hildred Laserherry, Amdrew Oboyle, MD [facility]

## 2016-10-27 ENCOUNTER — Telehealth: Payer: Self-pay | Admitting: Obstetrics and Gynecology

## 2016-10-27 DIAGNOSIS — O219 Vomiting of pregnancy, unspecified: Secondary | ICD-10-CM

## 2016-10-27 MED ORDER — DOXYLAMINE-PYRIDOXINE ER 20-20 MG PO TBCR: 20.0000 mg | EXTENDED_RELEASE_TABLET | ORAL | 3 refills | Status: DC

## 2016-10-27 NOTE — Telephone Encounter (Signed)
RX sent

## 2016-10-27 NOTE — Telephone Encounter (Signed)
Patient has been taking the samples of bonjesta and it is working for her - can she get a refill sent into [facility] - [address]

## 2016-11-07 ENCOUNTER — Ambulatory Visit (INDEPENDENT_AMBULATORY_CARE_PROVIDER_SITE_OTHER): Payer: 59 | Admitting: Obstetrics and Gynecology

## 2016-11-07 VITALS — BP 99/63 | HR 79 | Ht 64.0 in | Wt 115.0 lb

## 2016-11-07 DIAGNOSIS — Z369 Encounter for antenatal screening, unspecified: Secondary | ICD-10-CM

## 2016-11-07 DIAGNOSIS — Z113 Encounter for screening for infections with a predominantly sexual mode of transmission: Secondary | ICD-10-CM

## 2016-11-07 DIAGNOSIS — Z3401 Encounter for supervision of normal first pregnancy, first trimester: Secondary | ICD-10-CM

## 2016-11-07 DIAGNOSIS — T7589XA Other specified effects of external causes, initial encounter: Secondary | ICD-10-CM

## 2016-11-07 DIAGNOSIS — Z1389 Encounter for screening for other disorder: Secondary | ICD-10-CM

## 2016-11-07 NOTE — Progress Notes (Signed)
Mikayla JewCatherine Brown presents for NOB nurse interview visit. Pregnancy confirmation done 10/21/2016 by Dr. Valentino Saxonherry. UPT-positive. LMP: 09/02/2016.  G-1.  P-0000. Pregnancy education material explained and given. Has cats in the home and does have to change litter box and one outside cat. She states she washes hands after changing cat litter and is going to get some gloves. Her boyfriend will not change cat litter box. Toxoplasmosis lab ordered. Also pt questioned eating blue crab and it is recommended to limit intake to 3-6 servings a month (6oz). This particular crab contains more mercury. Also uses Metrodinazole gel for face and this a catergory B medication. May use but recommended she use sparingly. She states she may call her dermatologist to see if there is something else she can use.  NOB labs ordered.  HIV labs and Drug screen were explained optional and she did not decline. Drug screen ordered. Continues to wean herself off marijuana. PNV encouraged. Genetic screening options discussed. Genetic testing: Ordered. Pt works for [employer] and will do the [employer].  Pt may discuss with provider. Pt states that her blood type is O negative. Pt. To follow up with provider in 3 weeks for NOB physical.  All questions answered.

## 2016-11-07 NOTE — Patient Instructions (Signed)
Pregnancy and Zika Virus Disease Zika virus disease, or Zika, is an illness that can spread to people from mosquitoes that carry the virus. It may also spread from person to person through infected body fluids. Zika first occurred in [place], but recently it has spread to new areas. The virus occurs in tropical climates. The location of Zika continues to change. Most people who become infected with Zika virus do not develop serious illness. However, Zika may cause birth defects in an unborn baby whose mother is infected with the virus. It may also increase the risk of miscarriage. What are the symptoms of Zika virus disease? In many cases, people who have been infected with Zika virus do not develop any symptoms. If symptoms appear, they usually start about a week after the person is infected. Symptoms are usually mild. They may include:  Fever.  Rash.  Red eyes.  Joint pain.  How does Zika virus disease spread? The main way that Zika virus spreads is through the bite of a certain type of mosquito. Unlike most types of mosquitos, which bite only at night, the type of mosquito that carries Zika virus bites both at night and during the day. Zika virus can also spread through sexual contact, through a blood transfusion, and from a mother to her baby before or during birth. Once you have had Zika virus disease, it is unlikely that you will get it again. Can I pass Zika to my baby during pregnancy? Yes, Zika can pass from a mother to her baby before or during birth. What problems can Zika cause for my baby? A woman who is infected with Zika virus while pregnant is at risk of having her baby born with a condition in which the brain or head is smaller than expected (microcephaly). Babies who have microcephaly can have developmental delays, seizures, hearing problems, and vision problems. Having Zika virus disease during pregnancy can also increase the risk of miscarriage. How can Zika virus disease be  prevented? There is no vaccine to prevent Zika. The best way to prevent the disease is to avoid infected mosquitoes and avoid exposure to body fluids that can spread the virus. Avoid any possible exposure to Zika by taking the following precautions. For women and their sex partners:  Avoid traveling to high-risk areas. The locations where Zika is being reported change often. To identify high-risk areas, check the CDC travel website: www.cdc.gov/zika/geo/index.html  If you or your sex partner must travel to a high-risk area, talk with a health care provider before and after traveling.  Take all precautions to avoid mosquito bites if you live in, or travel to, any of the high-risk areas. Insect repellents are safe to use during pregnancy.  Ask your health care provider when it is safe to have sexual contact.  For women:  If you are pregnant or trying to become pregnant, avoid sexual contact with persons who may have been exposed to Zika virus, persons who have possible symptoms of Zika, or persons whose history you are unsure about. If you choose to have sexual contact with someone who may have been exposed to Zika virus, use condoms correctly during the entire duration of sexual activity, every time. Do not share sexual devices, as you may be exposed to body fluids.  Ask your health care provider about when it is safe to attempt pregnancy after a possible exposure to Zika virus.  What steps should I take to avoid mosquito bites? Take these steps to avoid mosquito bites   when you are in a high-risk area:  Wear loose clothing that covers your arms and legs.  Limit your outdoor activities.  Do not open windows unless they have window screens.  Sleep under mosquito nets.  Use insect repellent. The best insect repellents have:  DEET, picaridin, oil of lemon eucalyptus (OLE), or IR3535 in them.  Higher amounts of an active ingredient in them.  Remember that insect repellents are safe to  use during pregnancy.  Do not use OLE on children who are younger than 3 years of age. Do not use insect repellent on babies who are younger than 2 months of age.  Cover your child's stroller with mosquito netting. Make sure the netting fits snugly and that any loose netting does not cover your child's mouth or nose. Do not use a blanket as a mosquito-protection cover.  Do not apply insect repellent underneath clothing.  If you are using sunscreen, apply the sunscreen before applying the insect repellent.  Treat clothing with permethrin. Do not apply permethrin directly to your skin. Follow label directions for safe use.  Get rid of standing water, where mosquitoes may reproduce. Standing water is often found in items such as buckets, bowls, animal food dishes, and flowerpots.  When you return from traveling to any high-risk area, continue taking actions to protect yourself against mosquito bites for 3 weeks, even if you show no signs of illness. This will prevent spreading Zika virus to uninfected mosquitoes. What should I know about the sexual transmission of Zika? People can spread Zika to their sexual partners during vaginal, anal, or oral sex, or by sharing sexual devices. Many people with Zika do not develop symptoms, so a person could spread the disease without knowing that they are infected. The greatest risk is to women who are pregnant or who may become pregnant. Zika virus can live longer in semen than it can live in blood. Couples can prevent sexual transmission of the virus by:  Using condoms correctly during the entire duration of sexual activity, every time. This includes vaginal, anal, and oral sex.  Not sharing sexual devices. Sharing increases your risk of being exposed to body fluid from another person.  Avoiding all sexual activity until your health care provider says it is safe.  Should I be tested for Zika virus? A sample of your blood can be tested for Zika virus. A  pregnant woman should be tested if she may have been exposed to the virus or if she has symptoms of Zika. She may also have additional tests done during her pregnancy, such ultrasound testing. Talk with your health care provider about which tests are recommended. This information is not intended to replace advice given to you by your health care provider. Make sure you discuss any questions you have with your health care provider. Document Released: 12/20/2014 Document Revised: 09/06/2015 Document Reviewed: 12/13/2014 Elsevier Interactive Patient Education  2018 [employer] Pregnancy and Toxoplasmosis Toxoplasmosis is an infection that is caused by a parasite. Usually, there are no symptoms and the body can fight off the infection. If you get toxoplasmosis during pregnancy, there is a chance that the infection will spread to your baby. If this happens, your baby may develop serious health problems, such as blindness, intellectual disabilities, and other neurological disorders. Some of these problems may not show up for years. How do people get toxoplasmosis? You can get toxoplasmosis if:  You touch anything that is contaminated with infected cat feces and then you touch your   mouth.  You eat raw or undercooked meat from an infected animal.  You eat fruits and vegetables that were grown in contaminated soil.  Your baby can get toxoplasmosis through your blood supply if you are infected during pregnancy or just before pregnancy. How can I protect myself and my baby against toxoplasmosis?  Do not get a new cat.  Do not touch stray cats.  If you have a sandbox, cover it when it is not being used.  Avoid working in soil where cats may leave feces.  Wear gloves when you work in the soil. Wash your hands with soap and water when you are finished.  If you have a cat: ? Have someone else change the cat's litter box daily. He or she should wash his or her hands afterward. ? Do not let your cat  outside. ? Do not feed your cat any raw meat.  Do not eat undercooked meat, especially meat that has never been frozen.  Wash and peel all fruits and vegetables before eating them.  Avoid drinking untreated water.  If you have toxoplasmosis and you are not pregnant, wait at least 6 months before becoming pregnant. How do I know if I have toxoplasmosis? The only way to know for sure that you have toxoplasmosis is with a test. People with toxoplasmosis do not always have symptoms. If symptoms are present, they may include:  A fever.  Swollen glands.  Muscle aches.  Headaches.  Feeling like you have a cold or the flu.  If you think you have toxoplasmosis, or if you think that you may have been exposed to it, call your health care provider. How is toxoplasmosis diagnosed? When you become pregnant, your health care provider may order a blood test to check whether you have ever had toxoplasmosis.  If you have had toxoplasmosis infection before, you cannot get it again.  If you have never had toxoplasmosis, your health care provider may repeat this test at a later date.  If you become infected during pregnancy, your health care provider may do more tests to find out whether the infection has spread to the baby.  Other tests may include an ultrasound and a test of your amniotic fluid (amniocentesis).  How is toxoplasmosis treated? Toxoplasmosis may be treated with antibiotics and other medicines.  Some of these medicines can lower your baby's chance of developing complications later on.  Medicines may need to be taken for up to one year.  What should I do at home if I am diagnosed with toxoplasmosis?  Take over-the-counter and prescription medicines only as told by your health care provider.  If you were prescribed an antibiotic medicine, take it as told by your health care provider. Do not stop taking the antibiotic even if you start to feel better.  Keep all follow-up visits  as told by your health care provider. This is important. This information is not intended to replace advice given to you by your health care provider. Make sure you discuss any questions you have with your health care provider. Document Released: 07/07/2000 Document Revised: 11/29/2015 Document Reviewed: 11/04/2015 Elsevier Interactive Patient Education  2018 [employer] Hyperemesis Gravidarum Hyperemesis gravidarum is a severe form of nausea and vomiting that happens during pregnancy. Hyperemesis is worse than morning sickness. It may cause you to have nausea or vomiting all day for many days. It may keep you from eating and drinking enough food and liquids. Hyperemesis usually occurs during the first half (the first 20   weeks) of pregnancy. It often goes away once a woman is in her second half of pregnancy. However, sometimes hyperemesis continues through an entire pregnancy. What are the causes? The cause of this condition is not known. It may be related to changes in chemicals (hormones) in the body during pregnancy, such as the high level of pregnancy hormone (human chorionic gonadotropin) or the increase in the female sex hormone (estrogen). What are the signs or symptoms? Symptoms of this condition include:  Severe nausea and vomiting.  Nausea that does not go away.  Vomiting that does not allow you to keep any food down.  Weight loss.  Body fluid loss (dehydration).  Having no desire to eat, or not liking food that you have previously enjoyed.  How is this diagnosed? This condition may be diagnosed based on:  A physical exam.  Your medical history.  Your symptoms.  Blood tests.  Urine tests.  How is this treated? This condition may be managed with medicine. If medicines to do not help relieve nausea and vomiting, you may need to receive fluids through an IV tube at the hospital. Follow these instructions at home:  Take over-the-counter and prescription medicines only  as told by your health care provider.  Avoid iron pills and multivitamins that contain iron for the first 3-4 months of pregnancy. If you take prescription iron pills, do not stop taking them unless your health care provider approves.  Take the following actions to help prevent nausea and vomiting: ? In the morning, before getting out of bed, try eating a couple of dry crackers or a piece of toast. ? Avoid foods and smells that upset your stomach. Fatty and spicy foods may make nausea worse. ? Eat 5-6 small meals a day. ? Do not drink fluids while eating meals. Drink between meals. ? Eat or suck on things that have ginger in them. Ginger can help relieve nausea. ? Avoid food preparation. The smell of food can spoil your appetite or trigger nausea.  Follow instructions from your health care provider about eating or drinking restrictions.  For snacks, eat high-protein foods, such as cheese.  Keep all follow-up and pre-birth (prenatal) visits as told by your health care provider. This is important. Contact a health care provider if:  You have pain in your abdomen.  You have a severe headache.  You have vision problems.  You are losing weight. Get help right away if:  You cannot drink fluids without vomiting.  You vomit blood.  You have constant nausea and vomiting.  You are very weak.  You are very thirsty.  You feel dizzy.  You faint.  You have a fever or other symptoms that last for more than 2-3 days.  You have a fever and your symptoms suddenly get worse. Summary  Hyperemesis gravidarum is a severe form of nausea and vomiting that happens during pregnancy.  Making some changes to your eating habits may help relieve nausea and vomiting.  This condition may be managed with medicine.  If medicines to do not help relieve nausea and vomiting, you may need to receive fluids through an IV tube at the hospital. This information is not intended to replace advice given to  you by your health care provider. Make sure you discuss any questions you have with your health care provider. Document Released: 03/31/2005 Document Revised: 11/28/2015 Document Reviewed: 11/28/2015 Elsevier Interactive Patient Education  2017 [employer] First Trimester of Pregnancy The first trimester of pregnancy is from week   1 until the end of week 13 (months 1 through 3). During this time, your baby will begin to develop inside you. At 6-8 weeks, the eyes and face are formed, and the heartbeat can be seen on ultrasound. At the end of 12 weeks, all the baby's organs are formed. Prenatal care is all the medical care you receive before the birth of your baby. Make sure you get good prenatal care and follow all of your doctor's instructions. Follow these instructions at home: Medicines  Take over-the-counter and prescription medicines only as told by your doctor. Some medicines are safe and some medicines are not safe during pregnancy.  Take a prenatal vitamin that contains at least 600 micrograms (mcg) of folic acid.  If you have trouble pooping (constipation), take medicine that will make your stool soft (stool softener) if your doctor approves. Eating and drinking  Eat regular, healthy meals.  Your doctor will tell you the amount of weight gain that is right for you.  Avoid raw meat and uncooked cheese.  If you feel sick to your stomach (nauseous) or throw up (vomit): ? Eat 4 or 5 small meals a day instead of 3 large meals. ? Try eating a few soda crackers. ? Drink liquids between meals instead of during meals.  To prevent constipation: ? Eat foods that are high in fiber, like fresh fruits and vegetables, whole grains, and beans. ? Drink enough fluids to keep your pee (urine) clear or pale yellow. Activity  Exercise only as told by your doctor. Stop exercising if you have cramps or pain in your lower belly (abdomen) or low back.  Do not exercise if it is too hot, too humid,  or if you are in a place of great height (high altitude).  Try to avoid standing for long periods of time. Move your legs often if you must stand in one place for a long time.  Avoid heavy lifting.  Wear low-heeled shoes. Sit and stand up straight.  You can have sex unless your doctor tells you not to. Relieving pain and discomfort  Wear a good support bra if your breasts are sore.  Take warm water baths (sitz baths) to soothe pain or discomfort caused by hemorrhoids. Use hemorrhoid cream if your doctor says it is okay.  Rest with your legs raised if you have leg cramps or low back pain.  If you have puffy, bulging veins (varicose veins) in your legs: ? Wear support hose or compression stockings as told by your doctor. ? Raise (elevate) your feet for 15 minutes, 3-4 times a day. ? Limit salt in your food. Prenatal care  Schedule your prenatal visits by the twelfth week of pregnancy.  Write down your questions. Take them to your prenatal visits.  Keep all your prenatal visits as told by your doctor. This is important. Safety  Wear your seat belt at all times when driving.  Make a list of emergency phone numbers. The list should include numbers for family, friends, the hospital, and police and fire departments. General instructions  Ask your doctor for a referral to a local prenatal class. Begin classes no later than at the start of month 6 of your pregnancy.  Ask for help if you need counseling or if you need help with nutrition. Your doctor can give you advice or tell you where to go for help.  Do not use hot tubs, steam rooms, or saunas.  Do not douche or use tampons or scented sanitary pads.    Do not cross your legs for long periods of time.  Avoid all herbs and alcohol. Avoid drugs that are not approved by your doctor.  Do not use any tobacco products, including cigarettes, chewing tobacco, and electronic cigarettes. If you need help quitting, ask your doctor. You may  get counseling or other support to help you quit.  Avoid cat litter boxes and soil used by cats. These carry germs that can cause birth defects in the baby and can cause a loss of your baby (miscarriage) or stillbirth.  Visit your dentist. At home, brush your teeth with a soft toothbrush. Be gentle when you floss. Contact a doctor if:  You are dizzy.  You have mild cramps or pressure in your lower belly.  You have a nagging pain in your belly area.  You continue to feel sick to your stomach, you throw up, or you have watery poop (diarrhea).  You have a bad smelling fluid coming from your vagina.  You have pain when you pee (urinate).  You have increased puffiness (swelling) in your face, hands, legs, or ankles. Get help right away if:  You have a fever.  You are leaking fluid from your vagina.  You have spotting or bleeding from your vagina.  You have very bad belly cramping or pain.  You gain or lose weight rapidly.  You throw up blood. It may look like coffee grounds.  You are around people who have German measles, fifth disease, or chickenpox.  You have a very bad headache.  You have shortness of breath.  You have any kind of trauma, such as from a fall or a car accident. Summary  The first trimester of pregnancy is from week 1 until the end of week 13 (months 1 through 3).  To take care of yourself and your unborn baby, you will need to eat healthy meals, take medicines only if your doctor tells you to do so, and do activities that are safe for you and your baby.  Keep all follow-up visits as told by your doctor. This is important as your doctor will have to ensure that your baby is healthy and growing well. This information is not intended to replace advice given to you by your health care provider. Make sure you discuss any questions you have with your health care provider. Document Released: 09/17/2007 Document Revised: 04/08/2016 Document Reviewed:  04/08/2016 Elsevier Interactive Patient Education  2017 [employer] Commonly Asked Questions During Pregnancy  Cats: A parasite can be excreted in cat feces.  To avoid exposure you need to have another person empty the little box.  If you must empty the litter box you will need to wear gloves.  Wash your hands after handling your cat.  This parasite can also be found in raw or undercooked meat so this should also be avoided.  Colds, Sore Throats, Flu: Please check your medication sheet to see what you can take for symptoms.  If your symptoms are unrelieved by these medications please call the office.  Dental Work: Most any dental work your dentist recommends is permitted.  X-rays should only be taken during the first trimester if absolutely necessary.  Your abdomen should be shielded with a lead apron during all x-rays.  Please notify your provider prior to receiving any x-rays.  Novocaine is fine; gas is not recommended.  If your dentist requires a note from us prior to dental work please call the office and we will provide one for you.    Exercise: Exercise is an important part of staying healthy during your pregnancy.  You may continue most exercises you were accustomed to prior to pregnancy.  Later in your pregnancy you will most likely notice you have difficulty with activities requiring balance like riding a bicycle.  It is important that you listen to your body and avoid activities that put you at a higher risk of falling.  Adequate rest and staying well hydrated are a must!  If you have questions about the safety of specific activities ask your provider.    Exposure to Children with illness: Try to avoid obvious exposure; report any symptoms to us when noted,  If you have chicken pos, red measles or mumps, you should be immune to these diseases.   Please do not take any vaccines while pregnant unless you have checked with your OB provider.  Fetal Movement: After 28 weeks we recommend you do  "kick counts" twice daily.  Lie or sit down in a calm quiet environment and count your baby movements "kicks".  You should feel your baby at least 10 times per hour.  If you have not felt 10 kicks within the first hour get up, walk around and have something sweet to eat or drink then repeat for an additional hour.  If count remains less than 10 per hour notify your provider.  Fumigating: Follow your pest control agent's advice as to how long to stay out of your home.  Ventilate the area well before re-entering.  Hemorrhoids:   Most over-the-counter preparations can be used during pregnancy.  Check your medication to see what is safe to use.  It is important to use a stool softener or fiber in your diet and to drink lots of liquids.  If hemorrhoids seem to be getting worse please call the office.   Hot Tubs:  Hot tubs Jacuzzis and saunas are not recommended while pregnant.  These increase your internal body temperature and should be avoided.  Intercourse:  Sexual intercourse is safe during pregnancy as long as you are comfortable, unless otherwise advised by your provider.  Spotting may occur after intercourse; report any bright red bleeding that is heavier than spotting.  Labor:  If you know that you are in labor, please go to the hospital.  If you are unsure, please call the office and let us help you decide what to do.  Lifting, straining, etc:  If your job requires heavy lifting or straining please check with your provider for any limitations.  Generally, you should not lift items heavier than that you can lift simply with your hands and arms (no back muscles)  Painting:  Paint fumes do not harm your pregnancy, but may make you ill and should be avoided if possible.  Latex or water based paints have less odor than oils.  Use adequate ventilation while painting.  Permanents & Hair Color:  Chemicals in hair dyes are not recommended as they cause increase hair dryness which can increase hair loss  during pregnancy.  " Highlighting" and permanents are allowed.  Dye may be absorbed differently and permanents may not hold as well during pregnancy.  Sunbathing:  Use a sunscreen, as skin burns easily during pregnancy.  Drink plenty of fluids; avoid over heating.  Tanning Beds:  Because their possible side effects are still unknown, tanning beds are not recommended.  Ultrasound Scans:  Routine ultrasounds are performed at approximately 20 weeks.  You will be able to see your baby's general anatomy an   if you would like to know the gender this can usually be determined as well.  If it is questionable when you conceived you may also receive an ultrasound early in your pregnancy for dating purposes.  Otherwise ultrasound exams are not routinely performed unless there is a medical necessity.  Although you can request a scan we ask that you pay for it when conducted because insurance does not cover " patient request" scans.  Work: If your pregnancy proceeds without complications you may work until your due date, unless your physician or employer advises otherwise.  Round Ligament Pain/Pelvic Discomfort:  Sharp, shooting pains not associated with bleeding are fairly common, usually occurring in the second trimester of pregnancy.  They tend to be worse when standing up or when you remain standing for long periods of time.  These are the result of pressure of certain pelvic ligaments called "round ligaments".  Rest, Tylenol and heat seem to be the most effective relief.  As the womb and fetus grow, they rise out of the pelvis and the discomfort improves.  Please notify the office if your pain seems different than that described.  It may represent a more serious condition.   

## 2016-11-08 LAB — CBC WITH DIFFERENTIAL/PLATELET
BASOS: 0 %
Basophils Absolute: 0 10*3/uL (ref 0.0–0.2)
EOS (ABSOLUTE): 0.1 10*3/uL (ref 0.0–0.4)
EOS: 1 %
HEMATOCRIT: 40.6 % (ref 34.0–46.6)
HEMOGLOBIN: 13.7 g/dL (ref 11.1–15.9)
IMMATURE GRANS (ABS): 0 10*3/uL (ref 0.0–0.1)
Immature Granulocytes: 0 %
LYMPHS ABS: 2.2 10*3/uL (ref 0.7–3.1)
LYMPHS: 23 %
MCH: 29.6 pg (ref 26.6–33.0)
MCHC: 33.7 g/dL (ref 31.5–35.7)
MCV: 88 fL (ref 79–97)
MONOCYTES: 6 %
Monocytes Absolute: 0.6 10*3/uL (ref 0.1–0.9)
NEUTROS ABS: 6.7 10*3/uL (ref 1.4–7.0)
Neutrophils: 70 %
Platelets: 277 10*3/uL (ref 150–379)
RBC: 4.63 x10E6/uL (ref 3.77–5.28)
RDW: 13.1 % (ref 12.3–15.4)
WBC: 9.6 10*3/uL (ref 3.4–10.8)

## 2016-11-08 LAB — TOXOPLASMA ANTIBODIES- IGG AND  IGM

## 2016-11-08 LAB — HIV ANTIBODY (ROUTINE TESTING W REFLEX): HIV Screen 4th Generation wRfx: NONREACTIVE

## 2016-11-08 LAB — ANTIBODY SCREEN: Antibody Screen: NEGATIVE

## 2016-11-08 LAB — HEPATITIS B SURFACE ANTIGEN: HEP B S AG: NEGATIVE

## 2016-11-08 LAB — RH TYPE: Rh Factor: NEGATIVE

## 2016-11-08 LAB — ABO

## 2016-11-08 LAB — RPR: RPR: NONREACTIVE

## 2016-11-08 LAB — VARICELLA ZOSTER ANTIBODY, IGG: VARICELLA: 1499 {index} (ref >165)

## 2016-11-08 LAB — RUBELLA SCREEN: RUBELLA: 3.21 {index} (ref >0.99)

## 2016-11-09 LAB — URINE CULTURE, OB REFLEX: Organism ID, Bacteria: NO GROWTH

## 2016-11-09 LAB — CULTURE, OB URINE

## 2016-11-10 LAB — GC/CHLAMYDIA PROBE AMP
CHLAMYDIA, DNA PROBE: NEGATIVE
NEISSERIA GONORRHOEAE BY PCR: NEGATIVE

## 2016-11-14 ENCOUNTER — Telehealth: Payer: Self-pay

## 2016-11-14 LAB — MATERNIT 21 PLUS CORE, BLOOD
CHROMOSOME 13: NEGATIVE
CHROMOSOME 21: NEGATIVE
Chromosome 18: NEGATIVE
Y Chromosome: NOT DETECTED

## 2016-11-14 NOTE — Telephone Encounter (Signed)
-----   Message from Hildred LaserAnika Cherry, MD sent at 11/14/2016 11:27 AM EDT ----- Please inform patient of normal MaterniT21.  Is a female infant if she desires to know the sex.

## 2016-11-14 NOTE — Telephone Encounter (Signed)
Called pt no answer. LM for pt informing her of negative genetic testing. Advised to call back if she desires to know sex.

## 2016-11-17 ENCOUNTER — Telehealth: Payer: Self-pay | Admitting: Obstetrics and Gynecology

## 2016-11-17 LAB — URINALYSIS, ROUTINE W REFLEX MICROSCOPIC
Bilirubin, UA: NEGATIVE
GLUCOSE, UA: NEGATIVE
KETONES UA: NEGATIVE
Leukocytes, UA: NEGATIVE
Nitrite, UA: NEGATIVE
RBC, UA: NEGATIVE
SPEC GRAV UA: 1.014 (ref 1.005–1.030)
UUROB: 0.2 mg/dL (ref 0.2–1.0)
pH, UA: 7 (ref 5.0–7.5)

## 2016-11-17 LAB — CANNABINOID (GC/MS), URINE
Cannabinoid: POSITIVE — AB
Carboxy THC (GC/MS): >300 ng/mL

## 2016-11-17 LAB — MONITOR DRUG PROFILE 14(MW)
AMPHETAMINE SCREEN URINE: NEGATIVE ng/mL
BARBITURATE SCREEN URINE: NEGATIVE ng/mL
BENZODIAZEPINE SCREEN, URINE: NEGATIVE ng/mL
Buprenorphine, Urine: NEGATIVE ng/mL
Cocaine (Metab) Scrn, Ur: NEGATIVE ng/mL
Creatinine(Crt), U: 81 mg/dL (ref 20.0–300.0)
FENTANYL, URINE: NEGATIVE pg/mL
Meperidine Screen, Urine: NEGATIVE ng/mL
Methadone Screen, Urine: NEGATIVE ng/mL
OXYCODONE+OXYMORPHONE UR QL SCN: NEGATIVE ng/mL
Opiate Scrn, Ur: NEGATIVE ng/mL
PH UR, DRUG SCRN: 7.1 (ref 4.5–8.9)
PROPOXYPHENE SCREEN URINE: NEGATIVE ng/mL
Phencyclidine Qn, Ur: NEGATIVE ng/mL
SPECIFIC GRAVITY: 1.013
Tramadol Screen, Urine: NEGATIVE ng/mL

## 2016-11-17 LAB — NICOTINE SCREEN, URINE: COTININE UR QL SCN: NEGATIVE ng/mL

## 2016-11-17 NOTE — Telephone Encounter (Signed)
Please call with results of genetics

## 2016-11-17 NOTE — Telephone Encounter (Signed)
Called pt she states that she desires to know the sex of fetus, informed pt of female fetus.

## 2016-11-18 NOTE — Progress Notes (Signed)
I have reviewed the record and concur with patient management and plan.  Random Dobrowski, MD [facility]     

## 2016-12-02 ENCOUNTER — Ambulatory Visit (INDEPENDENT_AMBULATORY_CARE_PROVIDER_SITE_OTHER): Payer: 59

## 2016-12-02 ENCOUNTER — Ambulatory Visit (INDEPENDENT_AMBULATORY_CARE_PROVIDER_SITE_OTHER): Payer: 59 | Admitting: Obstetrics and Gynecology

## 2016-12-02 VITALS — BP 115/81 | HR 98 | Wt 115.1 lb

## 2016-12-02 DIAGNOSIS — Z3402 Encounter for supervision of normal first pregnancy, second trimester: Secondary | ICD-10-CM

## 2016-12-02 DIAGNOSIS — F1211 Cannabis abuse, in remission: Secondary | ICD-10-CM | POA: Insufficient documentation

## 2016-12-02 DIAGNOSIS — Z6791 Unspecified blood type, Rh negative: Secondary | ICD-10-CM

## 2016-12-02 DIAGNOSIS — O26899 Other specified pregnancy related conditions, unspecified trimester: Secondary | ICD-10-CM

## 2016-12-02 DIAGNOSIS — Z9889 Other specified postprocedural states: Secondary | ICD-10-CM

## 2016-12-02 DIAGNOSIS — O09899 Supervision of other high risk pregnancies, unspecified trimester: Secondary | ICD-10-CM

## 2016-12-02 LAB — POCT URINALYSIS DIPSTICK
Bilirubin, UA: NEGATIVE
Glucose, UA: NEGATIVE
KETONES UA: NEGATIVE
NITRITE UA: NEGATIVE
PROTEIN UA: NEGATIVE
Spec Grav, UA: 1.015 (ref 1.010–1.025)
UROBILINOGEN UA: 0.2 U/dL
pH, UA: 8 (ref 5.0–8.0)

## 2016-12-02 NOTE — Progress Notes (Signed)
OBSTETRIC INITIAL PRENATAL VISIT  Subjective:    Mikayla Brown is being seen today for her first obstetrical visit.  This is a planned pregnancy. She is a G1P0000 female at [redacted]w[redacted]d gestation, Estimated Date of Delivery: 06/09/17 with Patient's last menstrual period 09/02/2016 (exact date).  Her obstetrical history is significant for marijuana use (curently weaning), and h/o infertility. Relationship with FOB: significant other, living together. Patient does intend to breast feed. Pregnancy history fully reviewed.    Obstetric History   G1   P0   T0   P0   A0   L0    SAB0   TAB0   Ectopic0   Multiple0   Live Births0     # Outcome Date GA Lbr Len/2nd Weight Sex Delivery Anes PTL Lv  1 Current               Gynecologic History:  Last pap smear was 09/20/2016.  Results were normal.  Admits h/o abnormal pap smears in the past. Has had a LEEP procedure.  Denies history of STIs.    Past Medical History:  Diagnosis Date  . Acne    followed by dermatology  . Acne   . Anxiety   . Esophagitis   . GERD (gastroesophageal reflux disease)   . Heavy menses   . HPV test positive   . Painful menstruation   . Rosacea   . Stress   . Weight loss      Family History  Problem Relation Age of Onset  . Thyroid disease Mother   . Breast cancer Maternal Grandmother   . Stomach cancer Maternal Grandmother   . Stomach cancer Other   . Colon cancer Neg Hx   . Diabetes Neg Hx   . Heart disease Neg Hx   . Ovarian cancer Neg Hx      Past Surgical History:  Procedure Laterality Date  . EYE SURGERY    . LEEP       Social History   Social History  . Marital status: Single    Spouse name: N/A  . Number of children: 0  . Years of education: N/A   Occupational History  .  [employer]   Social History Main Topics  . Smoking status: Never Smoker  . Smokeless tobacco: Never Used  . Alcohol use 0.0 oz/week     Comment: occasional  . Drug use: Yes    Types: Marijuana     Comment:  occas/nearly stopped  . Sexual activity: Yes    Birth control/ protection: None   Other Topics Concern  . Not on file   Social History Narrative  . No narrative on file     Current Outpatient Prescriptions on File Prior to Visit  Medication Sig Dispense Refill  . Doxylamine-Pyridoxine ER (BONJESTA) 20-20 MG TBCR Take 20 mg by mouth as directed. 120 tablet 3  . metroNIDAZOLE (METROGEL) 0.75 % gel Apply 1 application topically 2 (two) times daily.    . Prenatal Vit-Fe Fumarate-FA (MULTIVITAMIN-PRENATAL) 27-0.8 MG TABS tablet Take 1 tablet by mouth daily at 12 noon.     No current facility-administered medications on file prior to visit.      Allergies  Allergen Reactions  . Penicillins   . Sulfa Antibiotics      Review of Systems General:Not Present- Fever, Weight Loss and Weight Gain. Skin:Not Present- Rash. HEENT:Not Present- Blurred Vision, Headache and Bleeding Gums. Respiratory:Not Present- Difficulty Breathing. Breast:Not Present- Breast Mass. Cardiovascular:Not Present- Chest Pain, Elevated  Blood Pressure, Fainting / Blacking Out and Shortness of Breath. Gastrointestinal:Present - Nausea (controlled on meds). Not Present- Abdominal Pain, Constipation, Vomiting. Female Genitourinary:Not Present- Frequency, Painful Urination, Pelvic Pain, Vaginal Bleeding, Vaginal Discharge, Contractions, regular, Fetal Movements Decreased, Urinary Complaints and Vaginal Fluid. Musculoskeletal:Not Present- Back Pain and Leg Cramps. Neurological:Not Present- Dizziness. Psychiatric:Not Present- Depression.     Objective:   Blood pressure 115/81, pulse 98, weight 115 lb 1.6 oz (52.2 kg), last menstrual period 09/02/2016.  Body mass index is 19.76 kg/m.   General Appearance:    Alert, cooperative, no distress, appears stated age  Head:    Normocephalic, without obvious abnormality, atraumatic  Eyes:    PERRL, conjunctiva/corneas clear, EOM's intact, both eyes  Ears:     Normal external ear canals, both ears  Nose:   Nares normal, septum midline, mucosa normal, no drainage or sinus tenderness  Throat:   Lips, mucosa, and tongue normal; teeth and gums normal  Neck:   Supple, symmetrical, trachea midline, no adenopathy; thyroid: no enlargement/tenderness/nodules; no carotid bruit or JVD  Back:     Symmetric, no curvature, ROM normal, no CVA tenderness  Lungs:     Clear to auscultation bilaterally, respirations unlabored  Chest Wall:    No tenderness or deformity   Heart:    Regular rate and rhythm, S1 and S2 normal, no murmur, rub or gallop  Breast Exam:    No tenderness, masses, or nipple abnormality  Abdomen:     Soft, non-tender, bowel sounds active all four quadrants, no masses, no organomegaly.  FH 13.  FHT 150  bpm.  Genitalia:    Pelvic:external genitalia normal, vagina without lesions, discharge, or tenderness, rectovaginal septum  normal. Cervix normal in appearance, no cervical motion tenderness, no adnexal masses or tenderness.  Pregnancy positive findings: uterine enlargement: 13 wk size, nontender.   Rectal:    Normal external sphincter.  No hemorrhoids appreciated. Internal exam not done.   Extremities:   Extremities normal, atraumatic, no cyanosis or edema  Pulses:   2+ and symmetric all extremities  Skin:   Skin color, texture, turgor normal, no rashes or lesions  Lymph nodes:   Cervical, supraclavicular, and axillary nodes normal  Neurologic:   CNII-XII intact, normal strength, sensation and reflexes throughout       Assessment:   Pregnancy at 13 and 0/7 weeks  Marijuana use, in remission Rh negative status History of LEEP  Plan:    Initial labs reviewed.  Rh negative, will need Rhogam at 28 weeks.  Prenatal vitamins encouraged. Problem list reviewed and updated. New OB counseling:  The patient has been given an overview regarding routine prenatal care.  Recommendations regarding diet, weight gain, and exercise in pregnancy were  given. Prenatal testing, optional genetic testing, and ultrasound use in pregnancy were reviewed.  Had MaterniT21: results reviewed, normal. Benefits of Breast Feeding were discussed. The patient is encouraged to consider nursing her baby post partum. Reports marijuana cessation 2 weeks ago.  H/o LEEP, patient has previously been informed on risk of preterm delivery (secondary to cervical incompetence) or for cervical stenosis.  Follow up in 4 weeks.  50% of 30 min visit spent on counseling and coordination of care.     Hildred Laser, MD [facility]

## 2016-12-30 ENCOUNTER — Ambulatory Visit (INDEPENDENT_AMBULATORY_CARE_PROVIDER_SITE_OTHER): Payer: 59 | Admitting: Obstetrics and Gynecology

## 2016-12-30 ENCOUNTER — Encounter: Payer: Self-pay | Admitting: Obstetrics and Gynecology

## 2016-12-30 ENCOUNTER — Other Ambulatory Visit: Payer: Self-pay | Admitting: Obstetrics and Gynecology

## 2016-12-30 VITALS — BP 105/67 | HR 87 | Wt 122.2 lb

## 2016-12-30 DIAGNOSIS — Z3402 Encounter for supervision of normal first pregnancy, second trimester: Secondary | ICD-10-CM

## 2016-12-30 LAB — POCT URINALYSIS DIPSTICK
BILIRUBIN UA: NEGATIVE
Blood, UA: NEGATIVE
GLUCOSE UA: NEGATIVE
KETONES UA: NEGATIVE
LEUKOCYTES UA: NEGATIVE
Nitrite, UA: NEGATIVE
Protein, UA: NEGATIVE
SPEC GRAV UA: 1.01 (ref 1.010–1.025)
Urobilinogen, UA: 0.2 E.U./dL
pH, UA: 5 (ref 5.0–8.0)

## 2016-12-30 NOTE — Progress Notes (Signed)
ROB:  No problems today. Nausea and vomiting resolved. Patient desires AFP for spina bifida. Fetal anatomic survey ultrasound scheduled for 20 weeks.

## 2017-01-02 LAB — AFP, SERUM, OPEN SPINA BIFIDA
AFP MOM: 0.66
AFP VALUE AFPOSL: 28.7 ng/mL
Gest. Age on Collection Date: 17.1 weeks
Maternal Age At EDD: 32 yr
OSBR RISK 1 IN: 10000
Test Results:: NEGATIVE
WEIGHT: 122 [lb_av]

## 2017-01-09 ENCOUNTER — Other Ambulatory Visit: Payer: Self-pay | Admitting: Obstetrics and Gynecology

## 2017-01-09 DIAGNOSIS — Z369 Encounter for antenatal screening, unspecified: Secondary | ICD-10-CM

## 2017-01-21 ENCOUNTER — Other Ambulatory Visit: Payer: 59

## 2017-01-22 ENCOUNTER — Ambulatory Visit (INDEPENDENT_AMBULATORY_CARE_PROVIDER_SITE_OTHER): Payer: 59

## 2017-01-22 DIAGNOSIS — Z369 Encounter for antenatal screening, unspecified: Secondary | ICD-10-CM | POA: Diagnosis not present

## 2017-01-27 ENCOUNTER — Encounter: Payer: Self-pay | Admitting: Obstetrics and Gynecology

## 2017-01-27 ENCOUNTER — Ambulatory Visit (INDEPENDENT_AMBULATORY_CARE_PROVIDER_SITE_OTHER): Payer: 59 | Admitting: Obstetrics and Gynecology

## 2017-01-27 VITALS — BP 97/60 | HR 75 | Wt 128.8 lb

## 2017-01-27 DIAGNOSIS — H5789 Other specified disorders of eye and adnexa: Secondary | ICD-10-CM

## 2017-01-27 DIAGNOSIS — Z3402 Encounter for supervision of normal first pregnancy, second trimester: Secondary | ICD-10-CM

## 2017-01-27 LAB — POCT URINALYSIS DIPSTICK
Bilirubin, UA: NEGATIVE
Blood, UA: NEGATIVE
GLUCOSE UA: NEGATIVE
Ketones, UA: NEGATIVE
LEUKOCYTES UA: NEGATIVE
Nitrite, UA: NEGATIVE
PROTEIN UA: NEGATIVE
Spec Grav, UA: 1.01 (ref 1.010–1.025)
UROBILINOGEN UA: 0.2 U/dL
pH, UA: 7 (ref 5.0–8.0)

## 2017-01-27 NOTE — Progress Notes (Signed)
ROB: Patient doing well.  Does complain of right eye irritation for several days, but denies redness, itching of the eye. Advised on keeping flushed with saline eye drops. If any changes, rcommend f/u with eye doctor . Declines flu shot today, will get later at work (works for [employer]). Anatomy scan normal but incomplete (need remaining heart views).  For repeat in 1-2 weeks. RTC in 4 weeks.

## 2017-01-27 NOTE — Progress Notes (Signed)
ROB- Pt states she has been doing well, She will get her flu shot at work, only complaint today is her right eye has some tenderness denies pain, lost of vision, discharge

## 2017-01-29 ENCOUNTER — Other Ambulatory Visit: Payer: Self-pay | Admitting: Obstetrics and Gynecology

## 2017-01-29 DIAGNOSIS — IMO0002 Reserved for concepts with insufficient information to code with codable children: Secondary | ICD-10-CM

## 2017-01-29 DIAGNOSIS — Z0489 Encounter for examination and observation for other specified reasons: Secondary | ICD-10-CM

## 2017-02-05 ENCOUNTER — Ambulatory Visit (INDEPENDENT_AMBULATORY_CARE_PROVIDER_SITE_OTHER): Payer: 59

## 2017-02-05 DIAGNOSIS — Z0489 Encounter for examination and observation for other specified reasons: Secondary | ICD-10-CM

## 2017-02-05 DIAGNOSIS — IMO0002 Reserved for concepts with insufficient information to code with codable children: Secondary | ICD-10-CM

## 2017-02-23 ENCOUNTER — Ambulatory Visit: Payer: 59 | Admitting: Internal Medicine

## 2017-02-24 ENCOUNTER — Ambulatory Visit (INDEPENDENT_AMBULATORY_CARE_PROVIDER_SITE_OTHER): Payer: Self-pay | Admitting: Obstetrics and Gynecology

## 2017-02-24 ENCOUNTER — Encounter: Payer: Self-pay | Admitting: Obstetrics and Gynecology

## 2017-02-24 VITALS — BP 104/67 | HR 88 | Wt 138.4 lb

## 2017-02-24 DIAGNOSIS — Z3402 Encounter for supervision of normal first pregnancy, second trimester: Secondary | ICD-10-CM

## 2017-02-24 LAB — POCT URINALYSIS DIPSTICK
Bilirubin, UA: NEGATIVE
Glucose, UA: NEGATIVE
KETONES UA: NEGATIVE
Leukocytes, UA: NEGATIVE
Nitrite, UA: NEGATIVE
PH UA: 6.5 (ref 5.0–8.0)
PROTEIN UA: NEGATIVE
RBC UA: NEGATIVE
SPEC GRAV UA: 1.01 (ref 1.010–1.025)
Urobilinogen, UA: 0.2 E.U./dL

## 2017-02-24 NOTE — Progress Notes (Signed)
ROB: No complaints.  Baby moving daily.  GCT next visit.

## 2017-02-26 ENCOUNTER — Ambulatory Visit (INDEPENDENT_AMBULATORY_CARE_PROVIDER_SITE_OTHER): Payer: 59 | Admitting: Internal Medicine

## 2017-02-26 ENCOUNTER — Encounter: Payer: Self-pay | Admitting: Internal Medicine

## 2017-02-26 VITALS — BP 112/80 | HR 88 | Temp 98.2°F | Resp 16 | Ht 64.17 in | Wt 138.6 lb

## 2017-02-26 DIAGNOSIS — F439 Reaction to severe stress, unspecified: Secondary | ICD-10-CM | POA: Diagnosis not present

## 2017-02-26 DIAGNOSIS — Z3A25 25 weeks gestation of pregnancy: Secondary | ICD-10-CM

## 2017-02-26 NOTE — Progress Notes (Signed)
Patient ID: Mikayla JewCatherine Brown, female   DOB: 1985/09/07, 31 y.o.   MRN: [id number]   Subjective:    Patient ID: Mikayla Brown, female    DOB: 1985/09/07, 31 y.o.   MRN: [id number]  HPI  Patient here for a scheduled follow up.  Is [redacted] weeks pregnant.  Being followed at [facility].  Seeing Dr Valentino Saxonherry.  Doing well.  Feels good.  Pregnancy going well. No allergies.  No sob.  No acid reflux.  No abdominal pain.  Bowels moving.  Taking pre natal vitamins.  Dealing with stress.  Actually doing better.  Overall feels good.     Past Medical History:  Diagnosis Date  . Acne    followed by dermatology  . Acne   . Anxiety   . Esophagitis   . GERD (gastroesophageal reflux disease)   . Heavy menses   . HPV test positive   . Painful menstruation   . Rosacea   . Stress   . Weight loss    Past Surgical History:  Procedure Laterality Date  . EYE SURGERY    . LEEP     Family History  Problem Relation Age of Onset  . Thyroid disease Mother   . Breast cancer Maternal Grandmother   . Stomach cancer Maternal Grandmother   . Stomach cancer Other   . Colon cancer Neg Hx   . Diabetes Neg Hx   . Heart disease Neg Hx   . Ovarian cancer Neg Hx    Social History   Socioeconomic History  . Marital status: Single    Spouse name: None  . Number of children: 0  . Years of education: None  . Highest education level: None  Social Needs  . Financial resource strain: None  . Food insecurity - worry: None  . Food insecurity - inability: None  . Transportation needs - medical: None  . Transportation needs - non-medical: None  Occupational History    Employer: [employer]  Tobacco Use  . Smoking status: Never Smoker  . Smokeless tobacco: Never Used  Substance and Sexual Activity  . Alcohol use: Yes    Alcohol/week: 0.0 oz    Comment: occasional  . Drug use: No    Comment: occas/nearly stopped  . Sexual activity: Yes    Birth control/protection: None  Other Topics Concern  . None  Social  History Narrative  . None    Outpatient Encounter Medications as of 02/26/2017  Medication Sig  . Prenatal Vit-Fe Fumarate-FA (MULTIVITAMIN-PRENATAL) 27-0.8 MG TABS tablet Take 1 tablet by mouth daily at 12 noon.   No facility-administered encounter medications on file as of 02/26/2017.     Review of Systems  Constitutional: Negative for fever.       Eating well.   HENT: Negative for congestion and sinus pressure.   Respiratory: Negative for cough, chest tightness and shortness of breath.   Cardiovascular: Negative for chest pain, palpitations and leg swelling.  Gastrointestinal: Negative for abdominal pain, diarrhea, nausea and vomiting.  Genitourinary: Negative for difficulty urinating and dysuria.  Musculoskeletal: Negative for joint swelling and myalgias.  Skin: Negative for color change and rash.  Neurological: Negative for dizziness, light-headedness and headaches.  Psychiatric/Behavioral: Negative for agitation and dysphoric mood.       Objective:    Physical Exam  Constitutional: She appears well-developed and well-nourished. No distress.  HENT:  Nose: Nose normal.  Mouth/Throat: Oropharynx is clear and moist.  Neck: Neck supple. No thyromegaly present.  Cardiovascular: Normal rate and  regular rhythm.  Pulmonary/Chest: Breath sounds normal. No respiratory distress. She has no wheezes.  Abdominal: Soft. Bowel sounds are normal. There is no tenderness.  Musculoskeletal: She exhibits no edema or tenderness.  Lymphadenopathy:    She has no cervical adenopathy.  Skin: No rash noted. No erythema.  Psychiatric: She has a normal mood and affect. Her behavior is normal.    BP 112/80 (BP Location: Left Arm, Patient Position: Sitting, Cuff Size: Normal)   Pulse 88   Temp 98.2 F (36.8 C) (Oral)   Resp 16   Ht 5' 4.17" (1.63 m)   Wt 138 lb 9.6 oz (62.9 kg)   LMP 09/02/2016 (Exact Date)   SpO2 98%   BMI 23.66 kg/m  Wt Readings from Last 3 Encounters:  02/26/17 138  lb 9.6 oz (62.9 kg)  02/24/17 138 lb 7 oz (62.8 kg)  01/27/17 128 lb 12.8 oz (58.4 kg)     Lab Results  Component Value Date   WBC 9.6 11/07/2016   HGB 13.7 11/07/2016   HCT 40.6 11/07/2016   PLT 277 11/07/2016   GLUCOSE 81 08/21/2016   CHOL 140 08/21/2016   TRIG 40 08/21/2016   HDL 61 08/21/2016   LDLCALC 71 08/21/2016   ALT 9 08/21/2016   AST 12 08/21/2016   NA 140 08/21/2016   K 4.1 08/21/2016   CL 100 08/21/2016   CREATININE 0.64 08/21/2016   BUN 8 08/21/2016   CO2 24 08/21/2016   TSH 0.681 08/21/2016       Assessment & Plan:   Problem List Items Addressed This Visit    Stress    Doing better.  Discussed with her today.  No further intervention warranted.         Other Visit Diagnoses    [redacted] weeks gestation of pregnancy    -  Primary   Doing well.  Seeing OB/Gyn.         Dale DurhamSCOTT, Kellar Westberg, MD

## 2017-03-01 ENCOUNTER — Encounter: Payer: Self-pay | Admitting: Internal Medicine

## 2017-03-01 NOTE — Assessment & Plan Note (Signed)
Doing better.  Discussed with her today.  No further intervention warranted.

## 2017-03-17 ENCOUNTER — Encounter: Payer: Self-pay | Admitting: Obstetrics and Gynecology

## 2017-03-17 ENCOUNTER — Ambulatory Visit (INDEPENDENT_AMBULATORY_CARE_PROVIDER_SITE_OTHER): Payer: Self-pay | Admitting: Obstetrics and Gynecology

## 2017-03-17 ENCOUNTER — Other Ambulatory Visit: Payer: Self-pay

## 2017-03-17 VITALS — BP 101/64 | HR 87 | Wt 145.8 lb

## 2017-03-17 DIAGNOSIS — Z6791 Unspecified blood type, Rh negative: Secondary | ICD-10-CM

## 2017-03-17 DIAGNOSIS — O26899 Other specified pregnancy related conditions, unspecified trimester: Secondary | ICD-10-CM

## 2017-03-17 DIAGNOSIS — Z3403 Encounter for supervision of normal first pregnancy, third trimester: Secondary | ICD-10-CM

## 2017-03-17 DIAGNOSIS — O09899 Supervision of other high risk pregnancies, unspecified trimester: Secondary | ICD-10-CM

## 2017-03-17 DIAGNOSIS — Z113 Encounter for screening for infections with a predominantly sexual mode of transmission: Secondary | ICD-10-CM

## 2017-03-17 LAB — POCT URINALYSIS DIPSTICK
BILIRUBIN UA: NEGATIVE
Glucose, UA: NEGATIVE
KETONES UA: NEGATIVE
Nitrite, UA: NEGATIVE
PH UA: 7 (ref 5.0–8.0)
Protein, UA: NEGATIVE
RBC UA: NEGATIVE
Spec Grav, UA: 1.015 (ref 1.010–1.025)
Urobilinogen, UA: 0.2 E.U./dL

## 2017-03-17 MED ORDER — RHO D IMMUNE GLOBULIN 1500 UNITS IM SOSY
1500.0000 [IU] | PREFILLED_SYRINGE | Freq: Once | INTRAMUSCULAR | Status: AC
  Administered 2017-03-17: 1500 [IU] via INTRAMUSCULAR

## 2017-03-17 NOTE — Progress Notes (Signed)
ROB- Pt states she is doing well, TDAP-next visit

## 2017-03-17 NOTE — Progress Notes (Signed)
ROB: Doing well, no complaints.  For 28 week labs today.  Desires to breastfeed.  Desires  for contraception. For Tdap next visit (out of stock today).  For Rhogam today. Signed blood consent, discussed cord blood banking. RTC in 2 weeks.

## 2017-03-17 NOTE — Addendum Note (Signed)
Addended by: Garfield CorneaMABRY, JASMINE L on: 03/17/2017 10:55 AM   Modules accepted: Orders

## 2017-03-18 LAB — RPR: RPR: NONREACTIVE

## 2017-03-18 LAB — GLUCOSE, 1 HOUR GESTATIONAL: Gestational Diabetes Screen: 138 mg/dL (ref 65–139)

## 2017-03-18 LAB — CBC
Hematocrit: 31.8 % — ABNORMAL LOW (ref 34.0–46.6)
Hemoglobin: 10.3 g/dL — ABNORMAL LOW (ref 11.1–15.9)
MCH: 29.2 pg (ref 26.6–33.0)
MCHC: 32.4 g/dL (ref 31.5–35.7)
MCV: 90 fL (ref 79–97)
PLATELETS: 252 10*3/uL (ref 150–379)
RBC: 3.53 x10E6/uL — AB (ref 3.77–5.28)
RDW: 12.9 % (ref 12.3–15.4)
WBC: 9.5 10*3/uL (ref 3.4–10.8)

## 2017-04-01 ENCOUNTER — Ambulatory Visit (INDEPENDENT_AMBULATORY_CARE_PROVIDER_SITE_OTHER): Payer: 59 | Admitting: Obstetrics and Gynecology

## 2017-04-01 VITALS — BP 107/72 | HR 80 | Wt 148.5 lb

## 2017-04-01 DIAGNOSIS — Z23 Encounter for immunization: Secondary | ICD-10-CM | POA: Diagnosis not present

## 2017-04-01 DIAGNOSIS — Z3403 Encounter for supervision of normal first pregnancy, third trimester: Secondary | ICD-10-CM

## 2017-04-01 LAB — POCT URINALYSIS DIPSTICK
Bilirubin, UA: NEGATIVE
Blood, UA: NEGATIVE
Glucose, UA: NEGATIVE
Ketones, UA: NEGATIVE
LEUKOCYTES UA: NEGATIVE
Nitrite, UA: NEGATIVE
PROTEIN UA: NEGATIVE
Spec Grav, UA: 1.02 (ref 1.010–1.025)
Urobilinogen, UA: 0.2 E.U./dL
pH, UA: 6 (ref 5.0–8.0)

## 2017-04-01 NOTE — Progress Notes (Signed)
ROB-No complaints.  

## 2017-04-14 NOTE — L&D Delivery Note (Signed)
Delivery Summary for Mikayla Brown  Labor Events:   Preterm labor:   Rupture date:   Rupture time:   Rupture type: Spontaneous  Fluid Color:   Induction:   Augmentation:   Complications:   Cervical ripening:          Delivery:   Episiotomy:   Lacerations:   Repair suture:   Repair # of packets:   Blood loss (ml): 600   Information for the patient's newborn:  Mikayla Brown, Mikayla Brown [[id number]][[id number]]    Delivery 06/12/2017 6:33 AM by  Vaginal, Spontaneous Sex:  female Gestational Age: 2738w3d Delivery Clinician:   Living?:         APGARS  One minute Five minutes Ten minutes  Skin color:        Heart rate:        Grimace:        Muscle tone:        Breathing:        Totals: 7  9      Presentation/position:      Resuscitation:   Cord information:    Disposition of cord blood:     Blood gases sent?  Complications:   Placenta: Delivered:       appearance Newborn Measurements: Weight: 8 lb 11.7 oz (3960 g)  Height: 21.26"  Head circumference:    Chest circumference:    Other providers:    Additional  information: Forceps:   Vacuum:   Breech:   Observed anomalies       Delivery Note At 6:33 AM a viable and healthy female was delivered via Vaginal, Spontaneous (Presentation: vertex; ROA ).  APGAR: 7, 9; weight 3960 grams.   Placenta status: spontaneously removed, intact.  Cord: 3-vessel with the following complications: none.  Cord pH: not obtained.  Cord blood obtained for Rh negative status. Delayed cord clamping observed for 1 minute. Cord blood also collected for donation.   Anesthesia:  Epidural Episiotomy: None Lacerations:  Bilateral 1st degree periurethral tears, 2nd degree perineal tear Suture Repair: 2.0 3.0 vicryl Est. Blood Loss (mL):  600  Mom to postpartum.  Baby to Couplet care / Skin to Skin.  [name] 06/12/2017, 7:18 AM

## 2017-04-16 ENCOUNTER — Encounter: Payer: Self-pay | Admitting: Obstetrics and Gynecology

## 2017-04-16 ENCOUNTER — Ambulatory Visit (INDEPENDENT_AMBULATORY_CARE_PROVIDER_SITE_OTHER): Payer: Managed Care, Other (non HMO) | Admitting: Obstetrics and Gynecology

## 2017-04-16 VITALS — BP 108/71 | HR 81 | Wt 147.9 lb

## 2017-04-16 DIAGNOSIS — Z3403 Encounter for supervision of normal first pregnancy, third trimester: Secondary | ICD-10-CM

## 2017-04-16 LAB — POCT URINALYSIS DIPSTICK
BILIRUBIN UA: NEGATIVE
Blood, UA: NEGATIVE
GLUCOSE UA: NEGATIVE
KETONES UA: NEGATIVE
Nitrite, UA: NEGATIVE
Protein, UA: NEGATIVE
SPEC GRAV UA: 1.01 (ref 1.010–1.025)
Urobilinogen, UA: 0.2 E.U./dL
pH, UA: 6.5 (ref 5.0–8.0)

## 2017-04-16 NOTE — Progress Notes (Signed)
ROB: Doing well, no major complaints. RTC in 2 weeks.

## 2017-04-16 NOTE — Progress Notes (Signed)
ROB- Pt states she is doing well,  

## 2017-04-30 ENCOUNTER — Encounter: Payer: Self-pay | Admitting: Obstetrics and Gynecology

## 2017-04-30 ENCOUNTER — Ambulatory Visit (INDEPENDENT_AMBULATORY_CARE_PROVIDER_SITE_OTHER): Payer: Managed Care, Other (non HMO) | Admitting: Obstetrics and Gynecology

## 2017-04-30 VITALS — BP 104/64 | HR 87 | Wt 156.3 lb

## 2017-04-30 DIAGNOSIS — Z3403 Encounter for supervision of normal first pregnancy, third trimester: Secondary | ICD-10-CM

## 2017-04-30 LAB — POCT URINALYSIS DIPSTICK
Bilirubin, UA: NEGATIVE
Glucose, UA: NEGATIVE
KETONES UA: NEGATIVE
Leukocytes, UA: NEGATIVE
Nitrite, UA: NEGATIVE
PROTEIN UA: NEGATIVE
RBC UA: NEGATIVE
SPEC GRAV UA: 1.015 (ref 1.010–1.025)
Urobilinogen, UA: 0.2 E.U./dL
pH, UA: 6 (ref 5.0–8.0)

## 2017-04-30 NOTE — Progress Notes (Signed)
ROB:  No complaints.  Needs GC/CT GBS next visit.

## 2017-05-07 ENCOUNTER — Ambulatory Visit (INDEPENDENT_AMBULATORY_CARE_PROVIDER_SITE_OTHER): Payer: Managed Care, Other (non HMO) | Admitting: Obstetrics and Gynecology

## 2017-05-07 VITALS — BP 119/77 | HR 91 | Wt 158.4 lb

## 2017-05-07 DIAGNOSIS — Z3403 Encounter for supervision of normal first pregnancy, third trimester: Secondary | ICD-10-CM | POA: Diagnosis not present

## 2017-05-07 DIAGNOSIS — Z113 Encounter for screening for infections with a predominantly sexual mode of transmission: Secondary | ICD-10-CM

## 2017-05-07 DIAGNOSIS — Z3685 Encounter for antenatal screening for Streptococcus B: Secondary | ICD-10-CM

## 2017-05-07 LAB — POCT URINALYSIS DIPSTICK
Bilirubin, UA: NEGATIVE
Blood, UA: NEGATIVE
Glucose, UA: NEGATIVE
Ketones, UA: NEGATIVE
LEUKOCYTES UA: NEGATIVE
NITRITE UA: NEGATIVE
PROTEIN UA: NEGATIVE
SPEC GRAV UA: 1.01 (ref 1.010–1.025)
Urobilinogen, UA: 0.2 E.U./dL
pH, UA: 6 (ref 5.0–8.0)

## 2017-05-07 NOTE — Progress Notes (Signed)
ROB: Doing well, no complaints. Continue to encourage no further weight gain in pregnancy. Discussed contraception again, patient desires abstinence as she and FOB are no longer together. GBS/GC/Cl done today. RTC in 1 week.

## 2017-05-09 LAB — GC/CHLAMYDIA PROBE AMP
CHLAMYDIA, DNA PROBE: NEGATIVE
NEISSERIA GONORRHOEAE BY PCR: NEGATIVE

## 2017-05-11 LAB — STREP GP B CULTURE+RFLX: Strep Gp B Culture+Rflx: NEGATIVE

## 2017-05-14 ENCOUNTER — Encounter: Payer: Self-pay | Admitting: Obstetrics and Gynecology

## 2017-05-14 ENCOUNTER — Ambulatory Visit (INDEPENDENT_AMBULATORY_CARE_PROVIDER_SITE_OTHER): Payer: Managed Care, Other (non HMO) | Admitting: Obstetrics and Gynecology

## 2017-05-14 VITALS — BP 110/70 | HR 85 | Wt 158.6 lb

## 2017-05-14 DIAGNOSIS — Z3403 Encounter for supervision of normal first pregnancy, third trimester: Secondary | ICD-10-CM

## 2017-05-14 NOTE — Progress Notes (Signed)
ROB- Patient feels well today with no complaints.  

## 2017-05-14 NOTE — Progress Notes (Signed)
ROB:  No complaints.  S&S discussed.

## 2017-05-20 ENCOUNTER — Ambulatory Visit (INDEPENDENT_AMBULATORY_CARE_PROVIDER_SITE_OTHER): Payer: Managed Care, Other (non HMO) | Admitting: Obstetrics and Gynecology

## 2017-05-20 VITALS — BP 95/63 | HR 81 | Wt 160.4 lb

## 2017-05-20 DIAGNOSIS — Z3403 Encounter for supervision of normal first pregnancy, third trimester: Secondary | ICD-10-CM

## 2017-05-20 LAB — POCT URINALYSIS DIPSTICK
Bilirubin, UA: NEGATIVE
Glucose, UA: NEGATIVE
Ketones, UA: NEGATIVE
NITRITE UA: NEGATIVE
PH UA: 7 (ref 5.0–8.0)
RBC UA: NEGATIVE
Spec Grav, UA: 1.015 (ref 1.010–1.025)
UROBILINOGEN UA: 0.2 U/dL

## 2017-05-20 NOTE — Addendum Note (Signed)
Addended by: Silvano BilisHAMPTON, Rockland Kotarski L on: 05/20/2017 09:17 AM   Modules accepted: Orders

## 2017-05-20 NOTE — Progress Notes (Signed)
ROB: Doing well, no complaints. Discussed labor precautions, pain management in labor (desires epidural).  36 week labs negative. RTC in 1 week.

## 2017-05-20 NOTE — Progress Notes (Signed)
PT is doing well.  

## 2017-05-20 NOTE — Patient Instructions (Signed)
Vaginal Delivery Vaginal delivery means that you will give birth by pushing your baby out of your birth canal (vagina). A team of health care providers will help you before, during, and after vaginal delivery. Birth experiences are unique for every woman and every pregnancy, and birth experiences vary depending on where you choose to give birth. What should I do to prepare for my baby's birth? Before your baby is born, it is important to talk with your health care provider about:  Your labor and delivery preferences. These may include: ? Medicines that you may be given. ? How you will manage your pain. This might include non-medical pain relief techniques or injectable pain relief such as epidural analgesia. ? How you and your baby will be monitored during labor and delivery. ? Who may be in the labor and delivery room with you. ? Your feelings about surgical delivery of your baby (cesarean delivery, or C-section) if this becomes necessary. ? Your feelings about receiving donated blood through an IV tube (blood transfusion) if this becomes necessary.  Whether you are able: ? To take pictures or videos of the birth. ? To eat during labor and delivery. ? To move around, walk, or change positions during labor and delivery.  What to expect after your baby is born, such as: ? Whether delayed umbilical cord clamping and cutting is offered. ? Who will care for your baby right after birth. ? Medicines or tests that may be recommended for your baby. ? Whether breastfeeding is supported in your hospital or birth center. ? How long you will be in the hospital or birth center.  How any medical conditions you have may affect your baby or your labor and delivery experience.  To prepare for your baby's birth, you should also:  Attend all of your health care visits before delivery (prenatal visits) as recommended by your health care provider. This is important.  Prepare your home for your baby's  arrival. Make sure that you have: ? Diapers. ? Baby clothing. ? Feeding equipment. ? Safe sleeping arrangements for you and your baby.  Install a car seat in your vehicle. Have your car seat checked by a certified car seat installer to make sure that it is installed safely.  Think about who will help you with your new baby at home for at least the first several weeks after delivery.  What can I expect when I arrive at the birth center or hospital? Once you are in labor and have been admitted into the hospital or birth center, your health care provider may:  Review your pregnancy history and any concerns you have.  Insert an IV tube into one of your veins. This is used to give you fluids and medicines.  Check your blood pressure, pulse, temperature, and heart rate (vital signs).  Check whether your bag of water (amniotic sac) has broken (ruptured).  Talk with you about your birth plan and discuss pain control options.  Monitoring Your health care provider may monitor your contractions (uterine monitoring) and your baby's heart rate (fetal monitoring). You may need to be monitored:  Often, but not continuously (intermittently).  All the time or for long periods at a time (continuously). Continuous monitoring may be needed if: ? You are taking certain medicines, such as medicine to relieve pain or make your contractions stronger. ? You have pregnancy or labor complications.  Monitoring may be done by:  Placing a special stethoscope or a handheld monitoring device on your abdomen to   check your baby's heartbeat, and feeling your abdomen for contractions. This method of monitoring does not continuously record your baby's heartbeat or your contractions.  Placing monitors on your abdomen (external monitors) to record your baby's heartbeat and the frequency and length of contractions. You may not have to wear external monitors all the time.  Placing monitors inside of your uterus  (internal monitors) to record your baby's heartbeat and the frequency, length, and strength of your contractions. ? Your health care provider may use internal monitors if he or she needs more information about the strength of your contractions or your baby's heart rate. ? Internal monitors are put in place by passing a thin, flexible wire through your vagina and into your uterus. Depending on the type of monitor, it may remain in your uterus or on your baby's head until birth. ? Your health care provider will discuss the benefits and risks of internal monitoring with you and will ask for your permission before inserting the monitors.  Telemetry. This is a type of continuous monitoring that can be done with external or internal monitors. Instead of having to stay in bed, you are able to move around during telemetry. Ask your health care provider if telemetry is an option for you.  Physical exam Your health care provider may perform a physical exam. This may include:  Checking whether your baby is positioned: ? With the head toward your vagina (head-down). This is most common. ? With the head toward the top of your uterus (head-up or breech). If your baby is in a breech position, your health care provider may try to turn your baby to a head-down position so you can deliver vaginally. If it does not seem that your baby can be born vaginally, your provider may recommend surgery to deliver your baby. In rare cases, you may be able to deliver vaginally if your baby is head-up (breech delivery). ? Lying sideways (transverse). Babies that are lying sideways cannot be delivered vaginally.  Checking your cervix to determine: ? Whether it is thinning out (effacing). ? Whether it is opening up (dilating). ? How low your baby has moved into your birth canal.  What are the three stages of labor and delivery?  Normal labor and delivery is divided into the following three stages: Stage 1  Stage 1 is the  longest stage of labor, and it can last for hours or days. Stage 1 includes: ? Early labor. This is when contractions may be irregular, or regular and mild. Generally, early labor contractions are more than 10 minutes apart. ? Active labor. This is when contractions get longer, more regular, more frequent, and more intense. ? The transition phase. This is when contractions happen very close together, are very intense, and may last longer than during any other part of labor.  Contractions generally feel mild, infrequent, and irregular at first. They get stronger, more frequent (about every 2-3 minutes), and more regular as you progress from early labor through active labor and transition.  Many women progress through stage 1 naturally, but you may need help to continue making progress. If this happens, your health care provider may talk with you about: ? Rupturing your amniotic sac if it has not ruptured yet. ? Giving you medicine to help make your contractions stronger and more frequent.  Stage 1 ends when your cervix is completely dilated to 4 inches (10 cm) and completely effaced. This happens at the end of the transition phase. Stage 2  Once   your cervix is completely effaced and dilated to 4 inches (10 cm), you may start to feel an urge to push. It is common for the body to naturally take a rest before feeling the urge to push, especially if you received an epidural or certain other pain medicines. This rest period may last for up to 1-2 hours, depending on your unique labor experience.  During stage 2, contractions are generally less painful, because pushing helps relieve contraction pain. Instead of contraction pain, you may feel stretching and burning pain, especially when the widest part of your baby's head passes through the vaginal opening (crowning).  Your health care provider will closely monitor your pushing progress and your baby's progress through the vagina during stage 2.  Your  health care provider may massage the area of skin between your vaginal opening and anus (perineum) or apply warm compresses to your perineum. This helps it stretch as the baby's head starts to crown, which can help prevent perineal tearing. ? In some cases, an incision may be made in your perineum (episiotomy) to allow the baby to pass through the vaginal opening. An episiotomy helps to make the opening of the vagina larger to allow more room for the baby to fit through.  It is very important to breathe and focus so your health care provider can control the delivery of your baby's head. Your health care provider may have you decrease the intensity of your pushing, to help prevent perineal tearing.  After delivery of your baby's head, the shoulders and the rest of the body generally deliver very quickly and without difficulty.  Once your baby is delivered, the umbilical cord may be cut right away, or this may be delayed for 1-2 minutes, depending on your baby's health. This may vary among health care providers, hospitals, and birth centers.  If you and your baby are healthy enough, your baby may be placed on your chest or abdomen to help maintain the baby's temperature and to help you bond with each other. Some mothers and babies start breastfeeding at this time. Your health care team will dry your baby and help keep your baby warm during this time.  Your baby may need immediate care if he or she: ? Showed signs of distress during labor. ? Has a medical condition. ? Was born too early (prematurely). ? Had a bowel movement before birth (meconium). ? Shows signs of difficulty transitioning from being inside the uterus to being outside of the uterus. If you are planning to breastfeed, your health care team will help you begin a feeding. Stage 3  The third stage of labor starts immediately after the birth of your baby and ends after you deliver the placenta. The placenta is an organ that develops  during pregnancy to provide oxygen and nutrients to your baby in the womb.  Delivering the placenta may require some pushing, and you may have mild contractions. Breastfeeding can stimulate contractions to help you deliver the placenta.  After the placenta is delivered, your uterus should tighten (contract) and become firm. This helps to stop bleeding in your uterus. To help your uterus contract and to control bleeding, your health care provider may: ? Give you medicine by injection, through an IV tube, by mouth, or through your rectum (rectally). ? Massage your abdomen or perform a vaginal exam to remove any blood clots that are left in your uterus. ? Empty your bladder by placing a thin, flexible tube (catheter) into your bladder. ? Encourage   you to breastfeed your baby. After labor is over, you and your baby will be monitored closely to ensure that you are both healthy until you are ready to go home. Your health care team will teach you how to care for yourself and your baby. This information is not intended to replace advice given to you by your health care provider. Make sure you discuss any questions you have with your health care provider. Document Released: 01/08/2008 Document Revised: 10/19/2015 Document Reviewed: 04/15/2015 Elsevier Interactive Patient Education  2018 [employer]  

## 2017-05-28 ENCOUNTER — Ambulatory Visit (INDEPENDENT_AMBULATORY_CARE_PROVIDER_SITE_OTHER): Payer: Managed Care, Other (non HMO) | Admitting: Obstetrics and Gynecology

## 2017-05-28 ENCOUNTER — Encounter: Payer: Self-pay | Admitting: Obstetrics and Gynecology

## 2017-05-28 VITALS — BP 115/80 | HR 88 | Wt 163.0 lb

## 2017-05-28 DIAGNOSIS — Z3403 Encounter for supervision of normal first pregnancy, third trimester: Secondary | ICD-10-CM | POA: Diagnosis not present

## 2017-05-28 LAB — POCT URINALYSIS DIPSTICK
BILIRUBIN UA: NEGATIVE
GLUCOSE UA: NEGATIVE
Ketones, UA: NEGATIVE
Nitrite, UA: NEGATIVE
Odor: NEGATIVE
PH UA: 7 (ref 5.0–8.0)
RBC UA: NEGATIVE
Spec Grav, UA: 1.01 (ref 1.010–1.025)
UROBILINOGEN UA: 0.2 U/dL

## 2017-05-28 NOTE — Patient Instructions (Signed)
1.  Return in 1 week for regular OB appointment with Dr. Cherry 

## 2017-05-28 NOTE — Progress Notes (Signed)
ROB- no complaints.  Cervix: 2/60/0/vertex/bag of water intact/posterior; head is deep in pelvis; estimated fetal weight 7 pounds

## 2017-06-04 ENCOUNTER — Ambulatory Visit (INDEPENDENT_AMBULATORY_CARE_PROVIDER_SITE_OTHER): Payer: Managed Care, Other (non HMO) | Admitting: Obstetrics and Gynecology

## 2017-06-04 VITALS — BP 94/65 | HR 83 | Wt 164.5 lb

## 2017-06-04 DIAGNOSIS — O480 Post-term pregnancy: Secondary | ICD-10-CM

## 2017-06-04 DIAGNOSIS — Z3403 Encounter for supervision of normal first pregnancy, third trimester: Secondary | ICD-10-CM

## 2017-06-04 LAB — POCT URINALYSIS DIPSTICK
Bilirubin, UA: NEGATIVE
Blood, UA: NEGATIVE
Glucose, UA: NEGATIVE
Ketones, UA: NEGATIVE
LEUKOCYTES UA: NEGATIVE
NITRITE UA: NEGATIVE
SPEC GRAV UA: 1.01 (ref 1.010–1.025)
Urobilinogen, UA: 0.2 E.U./dL
pH, UA: 7.5 (ref 5.0–8.0)

## 2017-06-04 NOTE — Progress Notes (Signed)
Pt is doing well. Pt has been having a lot of lower back pain.

## 2017-06-04 NOTE — Progress Notes (Signed)
ROB: Notes some lower back pain but otherwise ok.  Reiterated labor precautions. RTC in 1 week. Discussed if need for for IOL for post-dates, can be scheduled on 3/4 or 3/5. Will order BPP next week for post-dates.

## 2017-06-05 ENCOUNTER — Encounter: Payer: Managed Care, Other (non HMO) | Admitting: Obstetrics and Gynecology

## 2017-06-05 ENCOUNTER — Other Ambulatory Visit: Payer: Self-pay | Admitting: Obstetrics and Gynecology

## 2017-06-05 DIAGNOSIS — O480 Post-term pregnancy: Secondary | ICD-10-CM

## 2017-06-11 ENCOUNTER — Inpatient Hospital Stay: Payer: Managed Care, Other (non HMO) | Admitting: Anesthesiology

## 2017-06-11 ENCOUNTER — Other Ambulatory Visit: Payer: Self-pay

## 2017-06-11 ENCOUNTER — Encounter: Payer: Managed Care, Other (non HMO) | Admitting: Obstetrics and Gynecology

## 2017-06-11 ENCOUNTER — Inpatient Hospital Stay
Admission: EM | Admit: 2017-06-11 | Discharge: 2017-06-14 | DRG: 806 | Disposition: A | Discharge status: Home / Self Care | Payer: Managed Care, Other (non HMO) | Attending: Obstetrics and Gynecology | Admitting: Obstetrics and Gynecology

## 2017-06-11 ENCOUNTER — Other Ambulatory Visit: Payer: Managed Care, Other (non HMO)

## 2017-06-11 DIAGNOSIS — O26893 Other specified pregnancy related conditions, third trimester: Secondary | ICD-10-CM | POA: Diagnosis present

## 2017-06-11 DIAGNOSIS — O99324 Drug use complicating childbirth: Secondary | ICD-10-CM | POA: Diagnosis present

## 2017-06-11 DIAGNOSIS — F1211 Cannabis abuse, in remission: Secondary | ICD-10-CM | POA: Diagnosis present

## 2017-06-11 DIAGNOSIS — O26899 Other specified pregnancy related conditions, unspecified trimester: Secondary | ICD-10-CM

## 2017-06-11 DIAGNOSIS — Z3A40 40 weeks gestation of pregnancy: Secondary | ICD-10-CM

## 2017-06-11 DIAGNOSIS — O480 Post-term pregnancy: Secondary | ICD-10-CM | POA: Diagnosis present

## 2017-06-11 DIAGNOSIS — Z9889 Other specified postprocedural states: Secondary | ICD-10-CM

## 2017-06-11 DIAGNOSIS — Z6791 Unspecified blood type, Rh negative: Secondary | ICD-10-CM

## 2017-06-11 DIAGNOSIS — O09899 Supervision of other high risk pregnancies, unspecified trimester: Secondary | ICD-10-CM | POA: Diagnosis not present

## 2017-06-11 DIAGNOSIS — Z880 Allergy status to penicillin: Secondary | ICD-10-CM

## 2017-06-11 DIAGNOSIS — O4290 Premature rupture of membranes, unspecified as to length of time between rupture and onset of labor, unspecified weeks of gestation: Secondary | ICD-10-CM | POA: Diagnosis present

## 2017-06-11 DIAGNOSIS — O4292 Full-term premature rupture of membranes, unspecified as to length of time between rupture and onset of labor: Secondary | ICD-10-CM | POA: Diagnosis present

## 2017-06-11 DIAGNOSIS — O4202 Full-term premature rupture of membranes, onset of labor within 24 hours of rupture: Secondary | ICD-10-CM | POA: Diagnosis not present

## 2017-06-11 LAB — URINE DRUG SCREEN, QUALITATIVE (ARMC ONLY)
Amphetamines, Ur Screen: NOT DETECTED
BARBITURATES, UR SCREEN: NOT DETECTED
Benzodiazepine, Ur Scrn: NOT DETECTED
CANNABINOID 50 NG, UR ~~LOC~~: NOT DETECTED
COCAINE METABOLITE, UR ~~LOC~~: NOT DETECTED
MDMA (Ecstasy)Ur Screen: NOT DETECTED
Methadone Scn, Ur: NOT DETECTED
OPIATE, UR SCREEN: NOT DETECTED
Phencyclidine (PCP) Ur S: NOT DETECTED
Tricyclic, Ur Screen: NOT DETECTED

## 2017-06-11 LAB — CBC
HCT: 39.7 % (ref 35.0–47.0)
Hemoglobin: 13.8 g/dL (ref 12.0–16.0)
MCH: 30.4 pg (ref 26.0–34.0)
MCHC: 34.8 g/dL (ref 32.0–36.0)
MCV: 87.4 fL (ref 80.0–100.0)
PLATELETS: 213 10*3/uL (ref 150–440)
RBC: 4.55 MIL/uL (ref 3.80–5.20)
RDW: 13.9 % (ref 11.5–14.5)
WBC: 11.5 10*3/uL — AB (ref 3.6–11.0)

## 2017-06-11 LAB — RAPID HIV SCREEN (HIV 1/2 AB+AG)
HIV 1/2 Antibodies: NONREACTIVE
HIV-1 P24 ANTIGEN - HIV24: NONREACTIVE

## 2017-06-11 LAB — TYPE AND SCREEN
ABO/RH(D): O NEG
ANTIBODY SCREEN: NEGATIVE

## 2017-06-11 MED ORDER — AMMONIA AROMATIC IN INHA
RESPIRATORY_TRACT | Status: AC
  Filled 2017-06-11: qty 10

## 2017-06-11 MED ORDER — LACTATED RINGERS IV SOLN: 500.0000 mL | Freq: Once | INTRAVENOUS | Status: DC

## 2017-06-11 MED ORDER — SOD CITRATE-CITRIC ACID 500-334 MG/5ML PO SOLN: 30.0000 mL | ORAL | Status: DC | PRN

## 2017-06-11 MED ORDER — LIDOCAINE HCL (PF) 1 % IJ SOLN
INTRAMUSCULAR | Status: AC
  Filled 2017-06-11: qty 30

## 2017-06-11 MED ORDER — FENTANYL 2.5 MCG/ML W/ROPIVACAINE 0.15% IN NS 100 ML EPIDURAL (ARMC)
12.0000 mL/h | EPIDURAL | Status: DC
  Administered 2017-06-11: 12 mL/h via EPIDURAL

## 2017-06-11 MED ORDER — BUPIVACAINE HCL (PF) 0.25 % IJ SOLN
INTRAMUSCULAR | Status: DC | PRN
  Administered 2017-06-11: 5 mL via EPIDURAL

## 2017-06-11 MED ORDER — OXYCODONE-ACETAMINOPHEN 5-325 MG PO TABS: 1.0000 | ORAL_TABLET | ORAL | Status: DC | PRN

## 2017-06-11 MED ORDER — PHENYLEPHRINE 40 MCG/ML (10ML) SYRINGE FOR IV PUSH (FOR BLOOD PRESSURE SUPPORT): 80.0000 ug | PREFILLED_SYRINGE | INTRAVENOUS | Status: DC | PRN

## 2017-06-11 MED ORDER — ACETAMINOPHEN 325 MG PO TABS: 650.0000 mg | ORAL_TABLET | ORAL | Status: DC | PRN

## 2017-06-11 MED ORDER — FENTANYL 2.5 MCG/ML W/ROPIVACAINE 0.15% IN NS 100 ML EPIDURAL (ARMC)
EPIDURAL | Status: AC
  Filled 2017-06-11: qty 100

## 2017-06-11 MED ORDER — BUTORPHANOL TARTRATE 1 MG/ML IJ SOLN: 1.0000 mg | INTRAMUSCULAR | Status: DC | PRN

## 2017-06-11 MED ORDER — EPHEDRINE 5 MG/ML INJ: 10.0000 mg | INTRAVENOUS | Status: DC | PRN

## 2017-06-11 MED ORDER — DIPHENHYDRAMINE HCL 50 MG/ML IJ SOLN: 12.5000 mg | INTRAMUSCULAR | Status: DC | PRN

## 2017-06-11 MED ORDER — LIDOCAINE HCL (PF) 1 % IJ SOLN: 30.0000 mL | INTRAMUSCULAR | Status: DC | PRN

## 2017-06-11 MED ORDER — TERBUTALINE SULFATE 1 MG/ML IJ SOLN: 0.2500 mg | Freq: Once | INTRAMUSCULAR | Status: DC | PRN

## 2017-06-11 MED ORDER — LACTATED RINGERS IV SOLN: 500.0000 mL | INTRAVENOUS | Status: DC | PRN

## 2017-06-11 MED ORDER — OXYTOCIN BOLUS FROM INFUSION
500.0000 mL | Freq: Once | INTRAVENOUS | Status: AC
  Administered 2017-06-12: 500 mL via INTRAVENOUS

## 2017-06-11 MED ORDER — MISOPROSTOL 200 MCG PO TABS
ORAL_TABLET | ORAL | Status: AC
  Filled 2017-06-11: qty 4

## 2017-06-11 MED ORDER — OXYTOCIN 40 UNITS IN LACTATED RINGERS INFUSION - SIMPLE MED
1.0000 m[IU]/min | INTRAVENOUS | Status: DC
  Administered 2017-06-11: 2 m[IU]/min via INTRAVENOUS

## 2017-06-11 MED ORDER — ONDANSETRON HCL 4 MG/2ML IJ SOLN: 4.0000 mg | Freq: Four times a day (QID) | INTRAMUSCULAR | Status: DC | PRN

## 2017-06-11 MED ORDER — LACTATED RINGERS IV SOLN
INTRAVENOUS | Status: DC
  Administered 2017-06-11: 23:00:00 via INTRAVENOUS

## 2017-06-11 MED ORDER — OXYTOCIN 10 UNIT/ML IJ SOLN
INTRAMUSCULAR | Status: AC
  Filled 2017-06-11: qty 2

## 2017-06-11 MED ORDER — OXYTOCIN 40 UNITS IN LACTATED RINGERS INFUSION - SIMPLE MED
2.5000 [IU]/h | INTRAVENOUS | Status: DC
  Administered 2017-06-12: 2.5 [IU]/h via INTRAVENOUS
  Filled 2017-06-11: qty 1000

## 2017-06-11 MED ORDER — OXYCODONE-ACETAMINOPHEN 5-325 MG PO TABS: 2.0000 | ORAL_TABLET | ORAL | Status: DC | PRN

## 2017-06-11 NOTE — H&P (Addendum)
Obstetric History and Physical  Mikayla Brown is a 32 y.o. G1P0000 with IUP at [redacted]w[redacted]d presenting for complaints of leaking fluid since 0530 this morning. Patient states she has been having  irregular, every 10-12 minutes contractions, minimal vaginal bleeding, ruptured, clear fluid membranes, with active fetal movement.    Prenatal Course Source of Care: [facility] with onset of care at 9 weeks Pregnancy complications or risks: Patient Active Problem List   Diagnosis Date Noted  . PROM (premature rupture of membranes) 06/11/2017  . 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 Rh negative state in antepartum period 12/02/2016  . History of cervical dysplasia 10/21/2016  . History of loop electrical excision procedure (LEEP) 03/20/2016  . Dysplasia of cervix, low grade (CIN 1) 03/20/2016  . Stress 12/25/2013  . Vomiting 12/25/2013  . Esophagitis 12/25/2013  . Abnormal Pap smear of cervix 04/20/2013   She plans to breastfeed She desires oral contraceptives (estrogen/progesterone) for postpartum contraception.   Prenatal labs and studies: ABO, Rh: O NEG/NEG (02/28 1252).  Antibody: PENDING (02/28 1252) Rubella: 3.21 (07/27 1122) RPR: Non Reactive (12/04 0945)  HBsAg: Negative (07/27 1122)  HIV: NON REACTIVE (02/28 1252)  GBS: Negative 1 hr Glucola  normal Genetic screening normal Anatomy US normal   Past Medical History:  Diagnosis Date  . Acne    followed by dermatology  . Acne   . Anxiety   . Esophagitis   . GERD (gastroesophageal reflux disease)   . Heavy menses   . HPV test positive   . Painful menstruation   . Rosacea   . Stress   . Weight loss     Past Surgical History:  Procedure Laterality Date  . EYE SURGERY    . LEEP      OB History  Gravida Para Term Preterm AB Living  1 0 0 0 0 0  SAB TAB Ectopic Multiple Live Births  0 0 0 0      # Outcome Date GA Lbr Len/2nd Weight Sex Delivery Anes PTL Lv  1 Current               Social  History   Socioeconomic History  . Marital status: Single    Spouse name: None  . Number of children: 0  . Years of education: None  . Highest education level: None  Social Needs  . Financial resource strain: None  . Food insecurity - worry: None  . Food insecurity - inability: None  . Transportation needs - medical: None  . Transportation needs - non-medical: None  Occupational History    Employer: [employer]  Tobacco Use  . Smoking status: Never Smoker  . Smokeless tobacco: Never Used  Substance and Sexual Activity  . Alcohol use: Yes    Alcohol/week: 0.0 oz    Comment: occasional  . Drug use: No    Comment: occas/nearly stopped  . Sexual activity: Yes    Birth control/protection: None  Other Topics Concern  . None  Social History Narrative  . None    Family History  Problem Relation Age of Onset  . Thyroid disease Mother   . Breast cancer Maternal Grandmother   . Stomach cancer Maternal Grandmother   . Stomach cancer Other   . Colon cancer Neg Hx   . Diabetes Neg Hx   . Heart disease Neg Hx   . Ovarian cancer Neg Hx     Medications Prior to Admission  Medication Sig Dispense Refill Last Dose  .  ferrous sulfate 325 (65 FE) MG EC tablet Take 325 mg by mouth 3 (three) times daily with meals.   Taking  . Prenatal Vit-Fe Fumarate-FA (MULTIVITAMIN-PRENATAL) 27-0.8 MG TABS tablet Take 1 tablet by mouth daily at 12 noon.   Taking    Allergies  Allergen Reactions  . Penicillins   . Sulfa Antibiotics     Review of Systems: Negative except for what is mentioned in HPI.  Physical Exam: BP 123/87 (BP Location: Right Arm)   Pulse (!) 105   Temp 98.6 F (37 C) (Oral)   Resp 16   Wt 165 lb (74.8 kg)   LMP 09/02/2016 (Exact Date)   BMI 28.17 kg/m  CONSTITUTIONAL: Well-developed, well-nourished female in no acute distress.  HENT:  Normocephalic, atraumatic, External right and left ear normal. Oropharynx is clear and moist EYES: Conjunctivae and EOM are normal.  Pupils are equal, round, and reactive to light. No scleral icterus.  NECK: Normal range of motion, supple, no masses SKIN: Skin is warm and dry. No rash noted. Not diaphoretic. No erythema. No pallor. NEUROLOGIC: Alert and oriented to person, place, and time. Normal reflexes, muscle tone coordination. No cranial nerve deficit noted. PSYCHIATRIC: Normal mood and affect. Normal behavior. Normal judgment and thought content. CARDIOVASCULAR: Normal heart rate noted, regular rhythm RESPIRATORY: Effort and breath sounds normal, no problems with respiration noted ABDOMEN: Soft, nontender, nondistended, gravid. MUSCULOSKELETAL: Normal range of motion. No edema and no tenderness. 2+ distal pulses.  Cervical Exam: Dilatation 2 cm   Effacement 50/60%   Station -1.  Negative ferning but positive pooling of small amount of clear fluid on speculum exam.  Presentation: cephalic FHT:  Baseline rate 140 bpm   Variability moderate  Accelerations present   Decelerations none Contractions: occasional    Pertinent Labs/Studies:   Results for orders placed or performed during the hospital encounter of 06/11/17 (from the past 24 hour(s))  CBC     Status: Abnormal   Collection Time: 06/11/17 12:52 PM  Result Value Ref Range   WBC 11.5 (H) 3.6 - 11.0 K/uL   RBC 4.55 3.80 - 5.20 MIL/uL   Hemoglobin 13.8 12.0 - 16.0 g/dL   HCT 16.1 09.6 - 04.5 %   MCV 87.4 80.0 - 100.0 fL   MCH 30.4 26.0 - 34.0 pg   MCHC 34.8 32.0 - 36.0 g/dL   RDW 40.9 81.1 - 91.4 %   Platelets 213 150 - 440 K/uL  Type and screen [facility]     Status: None (Preliminary result)   Collection Time: 06/11/17 12:52 PM  Result Value Ref Range   ABO/RH(D) PENDING    Antibody Screen PENDING    Sample Expiration      06/14/2017 Performed at [facility], [address] [place], [place] [address]   Rapid HIV screen (HIV 1/2 Ab+Ag) ([facility] Only)     Status: None   Collection Time: 06/11/17 12:52 PM  Result Value  Ref Range   HIV-1 P24 Antigen - HIV24 NON REACTIVE NON REACTIVE   HIV 1/2 Antibodies NON REACTIVE NON REACTIVE   Interpretation (HIV Ag Ab)      A non reactive test result means that HIV 1 or HIV 2 antibodies and HIV 1 p24 antigen were not detected in the specimen.    Assessment : Mikayla Brown is a 32 y.o. G1P0000 at [redacted]w[redacted]d being admitted for induction of labor due to PROM. H/o LEEP.  H/o marijuana use in early pregnancy  Plan: Labor:  Induction as ordered as per protocol with Pitocin. Analgesia as needed. Admission labs ordered, including UDS. FWB: Reassuring fetal heart tracing.  GBS negative Delivery plan: Hopeful for vaginal delivery    Hildred Laserherry, Ramal Eckhardt, MD [facility]

## 2017-06-11 NOTE — Anesthesia Procedure Notes (Signed)
Epidural Patient location during procedure: OB  Staffing Anesthesiologist: Kadince Boxley, MD Performed: anesthesiologist   Preanesthetic Checklist Completed: patient identified, site marked, surgical consent, pre-op evaluation, timeout performed, IV checked, risks and benefits discussed and monitors and equipment checked  Epidural Patient position: sitting Prep: ChloraPrep Patient monitoring: heart rate, continuous pulse ox and blood pressure Approach: midline Location: L4-L5 Injection technique: LOR saline  Needle:  Needle type: Tuohy  Needle gauge: 18 G Needle length: 9 cm and 9 Catheter type: closed end flexible Catheter size: 20 Guage Test dose: negative and 1.5% lidocaine with Epi 1:200 K  Assessment Sensory level: T10 Events: blood not aspirated, injection not painful, no injection resistance, negative IV test and no paresthesia  Additional Notes   Patient tolerated the insertion well without complications.Reason for block:procedure for pain     

## 2017-06-11 NOTE — Progress Notes (Signed)
Intrapartum Progress Note  S: Patient doing well, no complaints.   O: Blood pressure 123/87, pulse (!) 105, temperature 98.6 F (37 C), temperature source Oral, resp. rate 16, weight 165 lb (74.8 kg), last menstrual period 09/02/2016. Gen App: NAD, comfortable Abdomen: soft, gravid FHT: baseline 150 bpm.  Accels present.  Decels absent. moderate in degree variability.   Tocometer: contractions irregular, q 4-6 minutes Cervix:  Extremities: Nontender, no edema.  Pitocin: 6 mIU  Labs:   Results for orders placed or performed during the hospital encounter of 06/11/17  CBC  Result Value Ref Range   WBC 11.5 (H) 3.6 - 11.0 K/uL   RBC 4.55 3.80 - 5.20 MIL/uL   Hemoglobin 13.8 12.0 - 16.0 g/dL   HCT 78.239.7 95.635.0 - 21.347.0 %   MCV 87.4 80.0 - 100.0 fL   MCH 30.4 26.0 - 34.0 pg   MCHC 34.8 32.0 - 36.0 g/dL   RDW 08.613.9 57.811.5 - 46.914.5 %   Platelets 213 150 - 440 K/uL  Rapid HIV screen (HIV 1/2 Ab+Ag) ([facility] Only)  Result Value Ref Range   HIV-1 P24 Antigen - HIV24 NON REACTIVE NON REACTIVE   HIV 1/2 Antibodies NON REACTIVE NON REACTIVE   Interpretation (HIV Ag Ab)      A non reactive test result means that HIV 1 or HIV 2 antibodies and HIV 1 p24 antigen were not detected in the specimen.  Urine Drug Screen, Qualitative ([facility] only)  Result Value Ref Range   Tricyclic, Ur Screen NONE DETECTED NONE DETECTED   Amphetamines, Ur Screen NONE DETECTED NONE DETECTED   MDMA (Ecstasy)Ur Screen NONE DETECTED NONE DETECTED   Cocaine Metabolite,Ur [place] NONE DETECTED NONE DETECTED   Opiate, Ur Screen NONE DETECTED NONE DETECTED   Phencyclidine (PCP) Ur S NONE DETECTED NONE DETECTED   Cannabinoid 50 Ng, Ur [place] NONE DETECTED NONE DETECTED   Barbiturates, Ur Screen NONE DETECTED NONE DETECTED   Benzodiazepine, Ur Scrn NONE DETECTED NONE DETECTED   Methadone Scn, Ur NONE DETECTED NONE DETECTED  Type and screen [facility]  Result Value Ref Range   ABO/RH(D) O NEG    Antibody Screen NEG    Sample Expiration      06/14/2017 Performed at [facility], [address] [place], [place] [id number]       Assessment:  1: SIUP at 2534w2d 2. PROM  Plan:  1. Continue active management with Pitocin for induction of labor 2. Will limit cervical exams until active labor ensues.    Hildred Laserherry, [place]illa Strieter, MD 06/11/2017 4:40 PM

## 2017-06-11 NOTE — OB Triage Note (Signed)
Pt states she woke up and it felt like she had peed on herself around 05:30am this morning. She continues to leak clear fluid with some blood tinge.

## 2017-06-11 NOTE — Progress Notes (Signed)
Intrapartum Progress Note  S: Patient starting to feel her contractions, but still mild pain, declines any medication.  O: Blood pressure 115/71, pulse 99, temperature 98.5 F (36.9 C), temperature source Oral, resp. rate 18, weight 165 lb (74.8 kg), last menstrual period 09/02/2016. Gen App: NAD, comfortable Abdomen: soft, gravid FHT: baseline 150 bpm.  Accels present.  Decels absent. moderate in degree variability.   Tocometer: contractions irregular, q 3-5 minutes Cervix: 4/60/0/forebag, ruptured with moderate meconium Extremities: Nontender, no edema.  Pitocin:  12 mIU, just increased to 14 mIU during exam.   Labs:  No new labs.     Assessment:  1: SIUP at 1659w2d 2. PROM 3. Meconium fluid  Plan:  1. Continue active management with Pitocin for induction of labor 2. Cervical checks every 4 hours.  3. Will notify Peds at time of delivery for meconium.   Hildred Laserherry, Merelin Human, MD 06/11/2017 8:32 PM

## 2017-06-11 NOTE — Anesthesia Preprocedure Evaluation (Signed)
Anesthesia Evaluation  Patient identified by MRN, date of birth, ID band Patient awake    Reviewed: Allergy & Precautions, NPO status , Patient's Chart, lab work & pertinent test results, reviewed documented beta blocker date and time   Airway Mallampati: II  TM Distance: >3 FB     Dental  (+) Chipped   Pulmonary           Cardiovascular      Neuro/Psych Anxiety    GI/Hepatic GERD  Controlled,  Endo/Other    Renal/GU      Musculoskeletal   Abdominal   Peds  Hematology   Anesthesia Other Findings   Reproductive/Obstetrics                             Anesthesia Physical Anesthesia Plan  ASA: II  Anesthesia Plan: Epidural   Post-op Pain Management:    Induction:   PONV Risk Score and Plan:   Airway Management Planned:   Additional Equipment:   Intra-op Plan:   Post-operative Plan:   Informed Consent: I have reviewed the patients History and Physical, chart, labs and discussed the procedure including the risks, benefits and alternatives for the proposed anesthesia with the patient or authorized representative who has indicated his/her understanding and acceptance.     Plan Discussed with: CRNA  Anesthesia Plan Comments:         Anesthesia Quick Evaluation

## 2017-06-12 DIAGNOSIS — Z3A40 40 weeks gestation of pregnancy: Secondary | ICD-10-CM

## 2017-06-12 DIAGNOSIS — O4202 Full-term premature rupture of membranes, onset of labor within 24 hours of rupture: Secondary | ICD-10-CM

## 2017-06-12 DIAGNOSIS — O09899 Supervision of other high risk pregnancies, unspecified trimester: Secondary | ICD-10-CM

## 2017-06-12 LAB — RPR: RPR Ser Ql: NONREACTIVE

## 2017-06-12 MED ORDER — PRENATAL MULTIVITAMIN CH
1.0000 | ORAL_TABLET | Freq: Every day | ORAL | Status: DC
  Administered 2017-06-12 – 2017-06-14 (×3): 1 via ORAL
  Filled 2017-06-12 (×3): qty 1

## 2017-06-12 MED ORDER — METHYLERGONOVINE MALEATE 0.2 MG/ML IJ SOLN: 0.2000 mg | INTRAMUSCULAR | Status: DC | PRN

## 2017-06-12 MED ORDER — ACETAMINOPHEN 325 MG PO TABS: 650.0000 mg | ORAL_TABLET | ORAL | Status: DC | PRN

## 2017-06-12 MED ORDER — DIBUCAINE 1 % RE OINT: 1.0000 "application " | TOPICAL_OINTMENT | RECTAL | Status: DC | PRN

## 2017-06-12 MED ORDER — SIMETHICONE 80 MG PO CHEW: 80.0000 mg | CHEWABLE_TABLET | ORAL | Status: DC | PRN

## 2017-06-12 MED ORDER — IBUPROFEN 800 MG PO TABS
ORAL_TABLET | ORAL | Status: AC
  Filled 2017-06-12: qty 1

## 2017-06-12 MED ORDER — METHYLERGONOVINE MALEATE 0.2 MG PO TABS
0.2000 mg | ORAL_TABLET | ORAL | Status: DC | PRN
  Filled 2017-06-12: qty 1

## 2017-06-12 MED ORDER — WITCH HAZEL-GLYCERIN EX PADS
1.0000 "application " | MEDICATED_PAD | CUTANEOUS | Status: DC | PRN
  Administered 2017-06-12: 1 via TOPICAL
  Filled 2017-06-12: qty 100

## 2017-06-12 MED ORDER — IBUPROFEN 800 MG PO TABS
800.0000 mg | ORAL_TABLET | Freq: Four times a day (QID) | ORAL | Status: DC
  Administered 2017-06-12 – 2017-06-14 (×10): 800 mg via ORAL
  Filled 2017-06-12 (×9): qty 1

## 2017-06-12 MED ORDER — COCONUT OIL OIL
1.0000 "application " | TOPICAL_OIL | Status: DC | PRN
  Administered 2017-06-13: 1 via TOPICAL
  Filled 2017-06-12: qty 120

## 2017-06-12 MED ORDER — BENZOCAINE-MENTHOL 20-0.5 % EX AERO
1.0000 "application " | INHALATION_SPRAY | CUTANEOUS | Status: DC | PRN
  Administered 2017-06-12: 1 via TOPICAL
  Filled 2017-06-12: qty 56

## 2017-06-12 MED ORDER — SENNOSIDES-DOCUSATE SODIUM 8.6-50 MG PO TABS
2.0000 | ORAL_TABLET | ORAL | Status: DC
  Administered 2017-06-13 – 2017-06-14 (×2): 2 via ORAL
  Filled 2017-06-12 (×2): qty 2

## 2017-06-12 MED ORDER — ONDANSETRON HCL 4 MG/2ML IJ SOLN: 4.0000 mg | INTRAMUSCULAR | Status: DC | PRN

## 2017-06-12 MED ORDER — DIPHENHYDRAMINE HCL 25 MG PO CAPS: 25.0000 mg | ORAL_CAPSULE | Freq: Four times a day (QID) | ORAL | Status: DC | PRN

## 2017-06-12 MED ORDER — ONDANSETRON HCL 4 MG PO TABS: 4.0000 mg | ORAL_TABLET | ORAL | Status: DC | PRN

## 2017-06-12 MED ORDER — ZOLPIDEM TARTRATE 5 MG PO TABS: 5.0000 mg | ORAL_TABLET | Freq: Every evening | ORAL | Status: DC | PRN

## 2017-06-12 NOTE — Lactation Note (Signed)
This note was copied from a baby's chart. Lactation Consultation Note  Patient Name: Mikayla Brown Today's Date: 06/12/2017 Reason for consult: Initial assessment;Primapara   Maternal Data Formula Feeding for Exclusion: No Has patient been taught Hand Expression?: Yes Does the patient have breastfeeding experience prior to this delivery?: No  Feeding Feeding Type: Breast Fed Length of feed: (right breast) Sleepy baby but able to stimulate to alertness and latch LATCH Score Latch: Grasps breast easily, tongue down, lips flanged, rhythmical sucking.  Audible Swallowing: A few with stimulation  Type of Nipple: Everted at rest and after stimulation  Comfort (Breast/Nipple): Soft / non-tender  Hold (Positioning): Assistance needed to correctly position infant at breast and maintain latch.  LATCH Score: 8  Interventions Interventions: Breast feeding basics reviewed;Assisted with latch;Breast massage;Adjust position;Support pillows;Position options  Lactation Tools Discussed/Used WIC Program: No   Consult Status Consult Status: PRN    Dyann KiefMarsha D Ralene Gasparyan 06/12/2017, 12:34 PM

## 2017-06-12 NOTE — Progress Notes (Signed)
Intrapartum Progress Note  S: Patient s/p epidural placement. Doing well.   O: Blood pressure 114/66, pulse 83, temperature 98.8 F (37.1 C), temperature source Oral, resp. rate 16, weight 165 lb (74.8 kg), last menstrual period 09/02/2016. Gen App: NAD, comfortable Abdomen: soft, gravid FHT: baseline 150 bpm.  Accels present.  Decels absent. moderate in degree variability.   Tocometer: contractions irregular, q 2-3 minutes Cervix: 5/70-80/0 Extremities: Nontender, no edema.  Pitocin:  14 mIU  Labs:  No new labs.     Assessment:  1: SIUP at 719w2d 2. PROM 3. Meconium fluid  Plan:  1. Continue active management with Pitocin for induction of labor 2. Cervical checks every 4 hours.  3. Will notify Peds at time of delivery for meconium.   Hildred Laserherry, Adelena Desantiago, MD 06/12/2017 12:28 AM

## 2017-06-12 NOTE — Progress Notes (Signed)
Pt up to the bathroom, voided, peri care performed.  Pt in wheelchair to transfer to M/B Rm 337.

## 2017-06-12 NOTE — Progress Notes (Signed)
Patient moved from room 337 to 344; TV not working

## 2017-06-13 LAB — CBC
HCT: 33.9 % — ABNORMAL LOW (ref 35.0–47.0)
Hemoglobin: 11.5 g/dL — ABNORMAL LOW (ref 12.0–16.0)
MCH: 30.2 pg (ref 26.0–34.0)
MCHC: 33.9 g/dL (ref 32.0–36.0)
MCV: 89.1 fL (ref 80.0–100.0)
PLATELETS: 211 10*3/uL (ref 150–440)
RBC: 3.8 MIL/uL (ref 3.80–5.20)
RDW: 14.1 % (ref 11.5–14.5)
WBC: 12.7 10*3/uL — ABNORMAL HIGH (ref 3.6–11.0)

## 2017-06-13 LAB — FETAL SCREEN: FETAL SCREEN: NEGATIVE

## 2017-06-13 MED ORDER — IBUPROFEN 800 MG PO TABS: 800.0000 mg | ORAL_TABLET | Freq: Three times a day (TID) | ORAL | 1 refills | Status: DC | PRN

## 2017-06-13 MED ORDER — RHO D IMMUNE GLOBULIN 1500 UNIT/2ML IJ SOSY
300.0000 ug | PREFILLED_SYRINGE | Freq: Once | INTRAMUSCULAR | Status: AC
  Administered 2017-06-13: 300 ug via INTRAVENOUS
  Filled 2017-06-13: qty 2

## 2017-06-13 NOTE — Discharge Summary (Signed)
Obstetric Discharge Summary Reason for Admission: rupture of membranes, postdates pregnancy at 40.2 weeks Prenatal Procedures: ultrasound Intrapartum Procedures: spontaneous vaginal delivery Postpartum Procedures: Rho(D) Ig Complications-Operative and Postpartum: 2nd degree degree perineal laceration and bilateral periurethral lacerations laceration    Hemoglobin  Date Value Ref Range Status  06/13/2017 11.5 (L) 12.0 - 16.0 g/dL Final  34/74/259512/07/2016 63.810.3 (L) 11.1 - 15.9 g/dL Final   HCT  Date Value Ref Range Status  06/13/2017 33.9 (L) 35.0 - 47.0 % Final   Hematocrit  Date Value Ref Range Status  03/17/2017 31.8 (L) 34.0 - 46.6 % Final    Physical Exam:  Vitals:   06/13/17 1319 06/13/17 1709 06/14/17 0133 06/14/17 0928  BP: 122/82 113/72 118/74 117/76  Pulse: 95 91 72 80  Resp: 18 18 18 20   Temp:  98.6 F (37 C) 98.3 F (36.8 C) 97.7 F (36.5 C)  TempSrc:  Oral Oral Oral  SpO2: 100% 100% 100% 100%  Weight:        General: alert and no distress Lochia: appropriate Uterine Fundus: firm Incision: not applicable DVT Evaluation: No evidence of DVT seen on physical exam. Negative Homan's sign. No cords or calf tenderness. No significant calf/ankle edema.  Discharge Diagnoses: Term Pregnancy-delivered, Rh negative status  Discharge Information: Date: 06/13/2017 Activity: pelvic rest Diet: routine Medications: PNV and Ibuprofen Condition: stable Instructions: refer to practice specific booklet Discharge to: home Follow-up Information    Mikayla Brown, Mikayla Shackleton, MD Follow up in 6 week(s).   Specialties:  Obstetrics and Gynecology, Radiology Why:  For postpartum visit Contact information: [address] [place] [address] [phone]           Newborn Data: Live born female  Birth Weight: 8 lb 11.7 oz (3960 g) APGAR: 7, 9  Newborn Delivery   Birth date/time:  06/12/2017 06:33:00 Delivery type:  Vaginal, Spontaneous     Home with  mother.  Mikayla Brown 06/15/2017, 3:38 PM

## 2017-06-13 NOTE — Progress Notes (Signed)
Post Partum Day # 1, s/p SVD  Subjective: no complaints, up ad lib, voiding and tolerating PO  Objective: Temp:  [98 F (36.7 C)-98.7 F (37.1 C)] 98 F (36.7 C) (03/02 0808) Pulse Rate:  [83-99] 83 (03/02 0808) Resp:  [14-20] 18 (03/02 0808) BP: (102-125)/(62-78) 108/74 (03/02 0808) SpO2:  [98 %-100 %] 100 % (03/02 40980808)  Physical Exam:  General: alert and no distress  Lungs: clear to auscultation bilaterally Breasts: normal appearance, no masses or tenderness Heart: regular rate and rhythm, S1, S2 normal, no murmur, click, rub or gallop Abdomen: soft, non-tender; bowel sounds normal; no masses,  no organomegaly Pelvis: Lochia: appropriate, Uterine Fundus: firm Extremities: DVT Evaluation: No evidence of DVT seen on physical exam. Negative Homan's sign. No cords or calf tenderness. No significant calf/ankle edema.  Recent Labs    06/11/17 1252 06/13/17 0534  HGB 13.8 11.5*  HCT 39.7 33.9*    Assessment/Plan: Doing well postpartum Breastfeeding, Lactation consult as needed Contraception combined OCPs Plan for discharge tomorrow,    LOS: 2 days   Hildred Laserherry, Yasmin Bronaugh, MD [facility] 06/13/2017 11:11 AM

## 2017-06-13 NOTE — Anesthesia Postprocedure Evaluation (Signed)
Anesthesia Post Note  Patient: Mikayla Brown  Procedure(s) Performed: AN AD HOC LABOR EPIDURAL  Patient location during evaluation: Mother Baby Anesthesia Type: Epidural Level of consciousness: awake and alert Pain management: pain level controlled Vital Signs Assessment: post-procedure vital signs reviewed and stable Respiratory status: spontaneous breathing, nonlabored ventilation and respiratory function stable Cardiovascular status: stable Postop Assessment: no headache, no backache and epidural receding Anesthetic complications: no     Last Vitals:  Vitals:   06/13/17 0808 06/13/17 1319  BP: 108/74 122/82  Pulse: 83 95  Resp: 18 18  Temp: 36.7 C   SpO2: 100% 100%    Last Pain:  Vitals:   06/13/17 0927  TempSrc:   PainSc: 1                  Mareo Portilla K

## 2017-06-13 NOTE — Clinical Social Work Note (Signed)
CSW received consult for possible drug exposed newborn. CSW will assess when able today due to imminent discharge.  Mikayla PonderKaren Martha Shalaya Swailes, MSW, Theresia MajorsLCSWA [phone]

## 2017-06-13 NOTE — Clinical Social Work Maternal (Signed)
CLINICAL SOCIAL WORK MATERNAL/CHILD NOTE  Patient Details  Name: Carlise Stofer MRN: [id number] Date of Birth: [birth year]  Date:  06/13/2017  Clinical Social Worker Initiating Note:  Santiago Bumpers, MSW, [place]  Date/Time: Initiated:  06/13/17/1331     Child's Name:  Antionette Char   Biological Parents:  Mother, Father   Need for Interpreter:  None   Reason for Referral:  Current Substance Use/Substance Use During Pregnancy    Address:  [place] [address]    Phone number:  [phone] (home)     Additional phone number: None  Household Members/Support Persons (HM/SP):       HM/SP Name Relationship DOB or Age  HM/SP -1        HM/SP -2        HM/SP -3        HM/SP -4        HM/SP -5        HM/SP -6        HM/SP -7        HM/SP -8          Natural Supports (not living in the home):  Community, Extended Family, Friends, Immediate Family, Education administrator, Armed forces technical officer, Spouse/significant other   Professional Supports:     Employment: Animator   Type of Work: Psychologist, forensic   Education:  Forensic psychologist   Homebound arranged:    Museum/gallery curator Resources:  Multimedia programmer   Other Resources:      Cultural/Religious Considerations Which May Impact Care:  None reported  Strengths:  Ability to meet basic needs , Compliance with medical plan , Home prepared for child , Understanding of illness, Pediatrician chosen   Psychotropic Medications:         Pediatrician:    [employer]  Pediatrician List:   [place]      Pediatrician Fax Number:    Risk Factors/Current Problems:  None   Cognitive State:  Able to Concentrate , Alert , Linear Thinking , Insightful , Goal Oriented    Mood/Affect:  Bright , Interested , Euthymic    CSW Assessment: The CSW met with the patient and her infant at bedside to discuss the need to  monitor umbilical cord tissue due to 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 The client gave verbal permission to discuss in front of her mother who was sleeping in the room. The client admitted to smoking marijuana prior to realizing that she was pregnant, and she indicated that she ceased as soon as she was aware of her pregnancy. The client has no desire, in her words, to use the substance again as "I have my child to focus on." The CSW inquired about her relationship with the FOB, and the patient reported that he has involvement with the family when he is not exhibiting anger related behaviors. The MOB and maternal grandmother denied any physical or verbal abuse.   The MOB is aware that the CSW will be mandated to contact DSS should the cord blood return positive for any substances. The MOB confirmed her contact information, her pediatrician chosen, her supports (her parents live next door), and her goals for parenting. The infant is the patient's first child, and she reported that she is excited to be a mother. As the UDS for both the infant and the MOB are negative, the CSW will monitor  the cord blood results. The CSW will sign off should the results be negative and contact DSS/CPS should the results be positive.  CSW Plan/Description:  CSW Will Continue to Monitor Umbilical Cord Tissue Drug Screen Results and Make Report if Frutoso Schatz, LCSW 06/13/2017, 1:32 PM

## 2017-06-14 LAB — RHOGAM INJECTION: UNIT DIVISION: 0

## 2017-06-14 NOTE — Progress Notes (Signed)
Post Partum Day # 2, s/p SVD  Subjective: no complaints, up ad lib, voiding and tolerating PO  Objective: Blood pressure 117/76, pulse 80, temperature 97.7 F (36.5 C), temperature source Oral, resp. rate 20, weight 165 lb (74.8 kg), last menstrual period 09/02/2016, SpO2 100 %, unknown if currently breastfeeding.  Physical Exam:  General: alert and no distress  Lungs: clear to auscultation bilaterally Breasts: normal appearance, no masses or tenderness Heart: regular rate and rhythm, S1, S2 normal, no murmur, click, rub or gallop Abdomen: soft, non-tender; bowel sounds normal; no masses,  no organomegaly Pelvis: Lochia: appropriate, Uterine Fundus: firm Extremities: DVT Evaluation: No evidence of DVT seen on physical exam. Negative Homan's sign. No cords or calf tenderness. No significant calf/ankle edema.  Recent Labs    06/11/17 1252 06/13/17 0534  HGB 13.8 11.5*  HCT 39.7 33.9*    Assessment/Plan: Doing well postpartum Breastfeeding, s/p Lactation consult  Contraception combined OCPs Plan for discharge today.     LOS: 3 days   Hildred Laserherry, Dael Howland, MD [facility] 06/14/2017 9:44 AM

## 2017-06-14 NOTE — Clinical Social Work Note (Signed)
Original note placed in the infant's chart:  CSW aware that the patient's cord blood toxicology testing resulted in negative readings for all tested substances. The CSW is signing off. Please consult should needs arise.  [place] PonderKaren Martha Jameshia Hayashida, MSW, Theresia MajorsLCSWA [phone]

## 2017-06-14 NOTE — Progress Notes (Signed)
Reviewed D/C instructions with pt who verbalized understanding of teaching. Discharged to home via W/C. Pt to schedule f/u appt in 6 weeks. Lactation H/O's given and explained.

## 2017-07-22 ENCOUNTER — Encounter: Payer: Self-pay | Admitting: Obstetrics and Gynecology

## 2017-07-22 ENCOUNTER — Ambulatory Visit (INDEPENDENT_AMBULATORY_CARE_PROVIDER_SITE_OTHER): Payer: Managed Care, Other (non HMO) | Admitting: Obstetrics and Gynecology

## 2017-07-22 NOTE — Progress Notes (Signed)
   OBSTETRICS POSTPARTUM CLINIC PROGRESS NOTE  Subjective:     Mikayla Brown is a 32 y.o. 611P1001 female who presents for a postpartum visit. She is 6 weeks postpartum following a spontaneous vaginal delivery. I have fully reviewed the prenatal and intrapartum course. The delivery was at 40.3 gestational weeks.  Anesthesia: epidural. Postpartum course has been well. Baby's course has been well. Baby is feeding by breast. Bleeding: patient has not resumed menses, with No LMP recorded.. Bowel function is normal. Bladder function is normal. Patient is not sexually active. Contraception method desired is abstinence. Postpartum depression screening: negative.  The following portions of the patient's history were reviewed and updated as appropriate: allergies, current medications, past family history, past medical history, past social history, past surgical history and problem list.  Review of Systems A comprehensive review of systems was negative.   Objective:    BP 111/73   Pulse 91   Wt 146 lb 12.8 oz (66.6 kg)   Breastfeeding? Yes   BMI 25.06 kg/m   General:  alert and no distress   Breasts:  inspection negative, no nipple discharge or bleeding, no masses or nodularity palpable  Lungs: clear to auscultation bilaterally  Heart:  regular rate and rhythm, S1, S2 normal, no murmur, click, rub or gallop  Abdomen: soft, non-tender; bowel sounds normal; no masses,  no organomegaly.    Vulva:  normal  Vagina: normal vagina, no discharge, exudate, lesion, or erythema  Cervix:  no cervical motion tenderness and no lesions  Corpus: normal size, contour, position, consistency, mobility, non-tender  Adnexa:  normal adnexa and no mass, fullness, tenderness  Rectal Exam: Not performed.         Labs:  Lab Results  Component Value Date   HGB 11.5 (L) 06/13/2017     Assessment:    Routine postpartum exam.    Plan:    1. Contraception: abstinence 2. Can resume all activities including  work.  Work notice given.  3. Follow up in: 3-6 months for annual exam, or sooner as needed.    Hildred Laserherry, Rhya Shan, MD [facility]

## 2017-07-22 NOTE — Progress Notes (Signed)
Pt is doing well. EPD=0

## 2017-09-21 ENCOUNTER — Telehealth: Payer: Self-pay | Admitting: Obstetrics and Gynecology

## 2017-09-21 NOTE — Telephone Encounter (Signed)
The patient left a voice message stating she has a very significant stye on her eyelid and she delivered her baby 3 mos ago and is still breastfeeding.  Her eye doctor asked her to call our office requesting info as to which antibiotics are safe with breastfeeding mothers.  She is allergic to penicillin and sulfa meds.  She can be reached at [phone].  Please advise, thanks.

## 2017-09-22 ENCOUNTER — Encounter: Payer: 59 | Admitting: Obstetrics and Gynecology

## 2017-09-22 NOTE — Telephone Encounter (Signed)
Pt was called no answer; was unable to leave a message due to no voicemail set up.

## 2017-09-25 ENCOUNTER — Other Ambulatory Visit: Payer: Self-pay

## 2017-09-25 NOTE — Telephone Encounter (Signed)
Pt was called to talk to her about medication for her eye. Pt did not answer the phone message was left via voicemail to call the office.

## 2017-09-28 NOTE — Telephone Encounter (Signed)
Pt called no answer LM to call the of call the office.

## 2017-10-06 ENCOUNTER — Encounter: Payer: Self-pay | Admitting: Obstetrics and Gynecology

## 2017-10-06 ENCOUNTER — Ambulatory Visit (INDEPENDENT_AMBULATORY_CARE_PROVIDER_SITE_OTHER): Payer: Managed Care, Other (non HMO) | Admitting: Obstetrics and Gynecology

## 2017-10-06 VITALS — BP 94/65 | HR 78 | Ht 64.0 in | Wt 146.2 lb

## 2017-10-06 DIAGNOSIS — Z01411 Encounter for gynecological examination (general) (routine) with abnormal findings: Secondary | ICD-10-CM

## 2017-10-06 DIAGNOSIS — Z01419 Encounter for gynecological examination (general) (routine) without abnormal findings: Secondary | ICD-10-CM

## 2017-10-06 DIAGNOSIS — R300 Dysuria: Secondary | ICD-10-CM | POA: Diagnosis not present

## 2017-10-06 DIAGNOSIS — N912 Amenorrhea, unspecified: Secondary | ICD-10-CM

## 2017-10-06 LAB — POCT URINALYSIS DIPSTICK
Bilirubin, UA: NEGATIVE
GLUCOSE UA: NEGATIVE
Ketones, UA: NEGATIVE
Nitrite, UA: NEGATIVE
Odor: NEGATIVE
PROTEIN UA: POSITIVE — AB
Spec Grav, UA: 1.015 (ref 1.010–1.025)
Urobilinogen, UA: 0.2 E.U./dL
pH, UA: 6 (ref 5.0–8.0)

## 2017-10-06 NOTE — Patient Instructions (Signed)
1.  No Pap smear is needed.  Next Pap smear is due 2021 2.  Self breast awareness is encouraged; watch for mastitis warning signs 3.  Continue with healthy eating and exercise 4.  Continue with multivitamin daily 5.  Contraception-abstinence 6.  Return in 1 year for annual exam 7.  Urine culture sent due to symptoms of possible UTI  Health Maintenance, Female Adopting a healthy lifestyle and getting preventive care can go a long way to promote health and wellness. Talk with your health care provider about what schedule of regular examinations is right for you. This is a good chance for you to check in with your provider about disease prevention and staying healthy. In between checkups, there are plenty of things you can do on your own. Experts have done a lot of research about which lifestyle changes and preventive measures are most likely to keep you healthy. Ask your health care provider for more information. Weight and diet Eat a healthy diet  Be sure to include plenty of vegetables, fruits, low-fat dairy products, and lean protein.  Do not eat a lot of foods high in solid fats, added sugars, or salt.  Get regular exercise. This is one of the most important things you can do for your health. ? Most adults should exercise for at least 150 minutes each week. The exercise should increase your heart rate and make you sweat (moderate-intensity exercise). ? Most adults should also do strengthening exercises at least twice a week. This is in addition to the moderate-intensity exercise.  Maintain a healthy weight  Body mass index (BMI) is a measurement that can be used to identify possible weight problems. It estimates body fat based on height and weight. Your health care provider can help determine your BMI and help you achieve or maintain a healthy weight.  For females 54 years of age and older: ? A BMI below 18.5 is considered underweight. ? A BMI of 18.5 to 24.9 is normal. ? A BMI of 25  to 29.9 is considered overweight. ? A BMI of 30 and above is considered obese.  Watch levels of cholesterol and blood lipids  You should start having your blood tested for lipids and cholesterol at 32 years of age, then have this test every 5 years.  You may need to have your cholesterol levels checked more often if: ? Your lipid or cholesterol levels are high. ? You are older than 32 years of age. ? You are at high risk for heart disease.  Cancer screening Lung Cancer  Lung cancer screening is recommended for adults 68-28 years old who are at high risk for lung cancer because of a history of smoking.  A yearly low-dose CT scan of the lungs is recommended for people who: ? Currently smoke. ? Have quit within the past 15 years. ? Have at least a 30-pack-year history of smoking. A pack year is smoking an average of one pack of cigarettes a day for 1 year.  Yearly screening should continue until it has been 15 years since you quit.  Yearly screening should stop if you develop a health problem that would prevent you from having lung cancer treatment.  Breast Cancer  Practice breast self-awareness. This means understanding how your breasts normally appear and feel.  It also means doing regular breast self-exams. Let your health care provider know about any changes, no matter how small.  If you are in your 20s or 30s, you should have a clinical breast  exam (CBE) by a health care provider every 1-3 years as part of a regular health exam.  If you are 45 or older, have a CBE every year. Also consider having a breast X-ray (mammogram) every year.  If you have a family history of breast cancer, talk to your health care provider about genetic screening.  If you are at high risk for breast cancer, talk to your health care provider about having an MRI and a mammogram every year.  Breast cancer gene (BRCA) assessment is recommended for women who have family members with BRCA-related cancers.  BRCA-related cancers include: ? Breast. ? Ovarian. ? Tubal. ? Peritoneal cancers.  Results of the assessment will determine the need for genetic counseling and BRCA1 and BRCA2 testing.  Cervical Cancer Your health care provider may recommend that you be screened regularly for cancer of the pelvic organs (ovaries, uterus, and vagina). This screening involves a pelvic examination, including checking for microscopic changes to the surface of your cervix (Pap test). You may be encouraged to have this screening done every 3 years, beginning at age 32.  For women ages 26-65, health care providers may recommend pelvic exams and Pap testing every 3 years, or they may recommend the Pap and pelvic exam, combined with testing for human papilloma virus (HPV), every 5 years. Some types of HPV increase your risk of cervical cancer. Testing for HPV may also be done on women of any age with unclear Pap test results.  Other health care providers may not recommend any screening for nonpregnant women who are considered low risk for pelvic cancer and who do not have symptoms. Ask your health care provider if a screening pelvic exam is right for you.  If you have had past treatment for cervical cancer or a condition that could lead to cancer, you need Pap tests and screening for cancer for at least 20 years after your treatment. If Pap tests have been discontinued, your risk factors (such as having a new sexual partner) need to be reassessed to determine if screening should resume. Some women have medical problems that increase the chance of getting cervical cancer. In these cases, your health care provider may recommend more frequent screening and Pap tests.  Colorectal Cancer  This type of cancer can be detected and often prevented.  Routine colorectal cancer screening usually begins at 32 years of age and continues through 32 years of age.  Your health care provider may recommend screening at an earlier age if  you have risk factors for colon cancer.  Your health care provider may also recommend using home test kits to check for hidden blood in the stool.  A small camera at the end of a tube can be used to examine your colon directly (sigmoidoscopy or colonoscopy). This is done to check for the earliest forms of colorectal cancer.  Routine screening usually begins at age 88.  Direct examination of the colon should be repeated every 5-10 years through 32 years of age. However, you may need to be screened more often if early forms of precancerous polyps or small growths are found.  Skin Cancer  Check your skin from head to toe regularly.  Tell your health care provider about any new moles or changes in moles, especially if there is a change in a mole's shape or color.  Also tell your health care provider if you have a mole that is larger than the size of a pencil eraser.  Always use sunscreen. Apply sunscreen  liberally and repeatedly throughout the day.  Protect yourself by wearing long sleeves, pants, a wide-brimmed hat, and sunglasses whenever you are outside.  Heart disease, diabetes, and high blood pressure  High blood pressure causes heart disease and increases the risk of stroke. High blood pressure is more likely to develop in: ? People who have blood pressure in the high end of the normal range (130-139/85-89 mm Hg). ? People who are overweight or obese. ? People who are African American.  If you are 48-58 years of age, have your blood pressure checked every 3-5 years. If you are 20 years of age or older, have your blood pressure checked every year. You should have your blood pressure measured twice-once when you are at a hospital or clinic, and once when you are not at a hospital or clinic. Record the average of the two measurements. To check your blood pressure when you are not at a hospital or clinic, you can use: ? An automated blood pressure machine at a pharmacy. ? A home blood  pressure monitor.  If you are between 18 years and 76 years old, ask your health care provider if you should take aspirin to prevent strokes.  Have regular diabetes screenings. This involves taking a blood sample to check your fasting blood sugar level. ? If you are at a normal weight and have a low risk for diabetes, have this test once every three years after 31 years of age. ? If you are overweight and have a high risk for diabetes, consider being tested at a younger age or more often. Preventing infection Hepatitis B  If you have a higher risk for hepatitis B, you should be screened for this virus. You are considered at high risk for hepatitis B if: ? You were born in a country where hepatitis B is common. Ask your health care provider which countries are considered high risk. ? Your parents were born in a high-risk country, and you have not been immunized against hepatitis B (hepatitis B vaccine). ? You have HIV or AIDS. ? You use needles to inject street drugs. ? You live with someone who has hepatitis B. ? You have had sex with someone who has hepatitis B. ? You get hemodialysis treatment. ? You take certain medicines for conditions, including cancer, organ transplantation, and autoimmune conditions.  Hepatitis C  Blood testing is recommended for: ? Everyone born from [birth year] through 1965. ? Anyone with known risk factors for hepatitis C.  Sexually transmitted infections (STIs)  You should be screened for sexually transmitted infections (STIs) including gonorrhea and chlamydia if: ? You are sexually active and are younger than 32 years of age. ? You are older than 32 years of age and your health care provider tells you that you are at risk for this type of infection. ? Your sexual activity has changed since you were last screened and you are at an increased risk for chlamydia or gonorrhea. Ask your health care provider if you are at risk.  If you do not have HIV, but are at risk,  it may be recommended that you take a prescription medicine daily to prevent HIV infection. This is called pre-exposure prophylaxis (PrEP). You are considered at risk if: ? You are sexually active and do not regularly use condoms or know the HIV status of your partner(s). ? You take drugs by injection. ? You are sexually active with a partner who has HIV.  Talk with your health care provider about  whether you are at high risk of being infected with HIV. If you choose to begin PrEP, you should first be tested for HIV. You should then be tested every 3 months for as long as you are taking PrEP. Pregnancy  If you are premenopausal and you may become pregnant, ask your health care provider about preconception counseling.  If you may become pregnant, take 400 to 800 micrograms (mcg) of folic acid every day.  If you want to prevent pregnancy, talk to your health care provider about birth control (contraception). Osteoporosis and menopause  Osteoporosis is a disease in which the bones lose minerals and strength with aging. This can result in serious bone fractures. Your risk for osteoporosis can be identified using a bone density scan.  If you are 58 years of age or older, or if you are at risk for osteoporosis and fractures, ask your health care provider if you should be screened.  Ask your health care provider whether you should take a calcium or vitamin D supplement to lower your risk for osteoporosis.  Menopause may have certain physical symptoms and risks.  Hormone replacement therapy may reduce some of these symptoms and risks. Talk to your health care provider about whether hormone replacement therapy is right for you. Follow these instructions at home:  Schedule regular health, dental, and eye exams.  Stay current with your immunizations.  Do not use any tobacco products including cigarettes, chewing tobacco, or electronic cigarettes.  If you are pregnant, do not drink  alcohol.  If you are breastfeeding, limit how much and how often you drink alcohol.  Limit alcohol intake to no more than 1 drink per day for nonpregnant women. One drink equals 12 ounces of beer, 5 ounces of wine, or 1 ounces of hard liquor.  Do not use street drugs.  Do not share needles.  Ask your health care provider for help if you need support or information about quitting drugs.  Tell your health care provider if you often feel depressed.  Tell your health care provider if you have ever been abused or do not feel safe at home. This information is not intended to replace advice given to you by your health care provider. Make sure you discuss any questions you have with your health care provider. Document Released: 10/14/2010 Document Revised: 09/06/2015 Document Reviewed: 01/02/2015 Elsevier Interactive Patient Education  [employer].

## 2017-10-06 NOTE — Progress Notes (Signed)
ANNUAL PREVENTATIVE CARE GYN  ENCOUNTER NOTE  Subjective:       Mikayla Brown is a 32 y.o. 301P1001 female here for a routine annual gynecologic exam.  Current complaints: 1.  Terminal voiding dysuria; patient is staying well-hydrated.  She feels slight discomfort just at the end of her urine stream.  Urinalysis today shows minimal blood. 2.  Breast-feeding well; lactational amenorrhea is present; patient notes no vaginal atrophy symptoms; she is not sexually active at this time.  Bowel and bladder function are normal.  She has not had any major interval health issues since the birth of her child approximately 5 months ago.   Gynecologic History No LMP recorded (lmp unknown). amenorrhea- due to lactating Contraception: abstinence Last Pap: 09/18/2016 neg/neg. Results were: normal Last mammogram: n/a. Results were:   Obstetric History OB History  Gravida Para Term Preterm AB Living  1 1 1  0 0 1  SAB TAB Ectopic Multiple Live Births  0 0 0 0 1    # Outcome Date GA Lbr Len/2nd Weight Sex Delivery Anes PTL Lv  1 Term 06/12/17 [id number] / 02:13 8 lb 11.7 oz (3.96 kg) F Vag-Spont EPI  LIV    Past Medical History:  Diagnosis Date  . Acne    followed by dermatology  . Acne   . Anxiety   . Esophagitis   . GERD (gastroesophageal reflux disease)   . Heavy menses   . HPV test positive   . Painful menstruation   . Rosacea   . Stress   . Weight loss     Past Surgical History:  Procedure Laterality Date  . EYE SURGERY    . LEEP      Current Outpatient Medications on File Prior to Visit  Medication Sig Dispense Refill  . Prenatal Vit-Fe Fumarate-FA (MULTIVITAMIN-PRENATAL) 27-0.8 MG TABS tablet Take 1 tablet by mouth daily at 12 noon.     No current facility-administered medications on file prior to visit.     Allergies  Allergen Reactions  . Penicillins   . Sulfa Antibiotics     Social History   Socioeconomic History  . Marital status: Single    Spouse name: Not on file   . Number of children: 0  . Years of education: Not on file  . Highest education level: Not on file  Occupational History    Employer: [employer]  Social Needs  . Financial resource strain: Not on file  . Food insecurity:    Worry: Not on file    Inability: Not on file  . Transportation needs:    Medical: Not on file    Non-medical: Not on file  Tobacco Use  . Smoking status: Never Smoker  . Smokeless tobacco: Never Used  Substance and Sexual Activity  . Alcohol use: Not Currently    Alcohol/week: 0.0 oz    Comment: occasional  . Drug use: No    Types: Marijuana    Comment: occas/nearly stopped  . Sexual activity: Not Currently    Birth control/protection: None  Lifestyle  . Physical activity:    Days per week: Not on file    Minutes per session: Not on file  . Stress: Not on file  Relationships  . Social connections:    Talks on phone: Not on file    Gets together: Not on file    Attends religious service: Not on file    Active member of club or organization: Not on file    Attends meetings  of clubs or organizations: Not on file    Relationship status: Not on file  . Intimate partner violence:    Fear of current or ex partner: Not on file    Emotionally abused: Not on file    Physically abused: Not on file    Forced sexual activity: Not on file  Other Topics Concern  . Not on file  Social History Narrative  . Not on file    Family History  Problem Relation Age of Onset  . Thyroid disease Mother   . Breast cancer Maternal Grandmother   . Stomach cancer Maternal Grandmother   . Stomach cancer Other   . Colon cancer Neg Hx   . Diabetes Neg Hx   . Heart disease Neg Hx   . Ovarian cancer Neg Hx     The following portions of the patient's history were reviewed and updated as appropriate: allergies, current medications, past family history, past medical history, past social history, past surgical history and problem list.  Review of Systems Review of Systems   Constitutional: Negative for chills, diaphoresis and fever.  HENT: Negative.   Eyes: Negative.   Respiratory: Negative.   Cardiovascular: Negative.   Gastrointestinal: Negative.   Genitourinary: Positive for dysuria.  Musculoskeletal: Negative.   Skin: Negative.   Neurological: Negative.   Endo/Heme/Allergies: Negative.   Psychiatric/Behavioral: Negative.       Objective:   BP 94/65   Pulse 78   Ht 5\' 4"  (1.626 m)   Wt 146 lb 3.2 oz (66.3 kg)   LMP  (LMP Unknown)   Breastfeeding? Yes   BMI 25.10 kg/m  CONSTITUTIONAL: Well-developed, well-nourished female in no acute distress.  PSYCHIATRIC: Normal mood and affect. Normal behavior. Normal judgment and thought content. NEUROLGIC: Alert and oriented to person, place, and time. Normal muscle tone coordination. No cranial nerve deficit noted. HENT:  Normocephalic, atraumatic, External right and left ear normal.  EYES: Conjunctivae and EOM are normal. No scleral icterus.  NECK: Normal range of motion, supple, no masses.  Normal thyroid.  SKIN: Skin is warm and dry. No rash noted. Not diaphoretic. No erythema. No pallor. CARDIOVASCULAR: Normal heart rate noted, regular rhythm, no murmur. RESPIRATORY: Clear to auscultation bilaterally. Effort and breath sounds normal, no problems with respiration noted. BREASTS: Symmetric in size.  Lactation changes present; no evidence of mastitis; nipples are healthy ABDOMEN: Soft, normal bowel sounds, no distention noted.  No tenderness, rebound or guarding.  BLADDER: Normal PELVIC:  External Genitalia: Normal  BUS: Normal  Vagina: Decreased estrogen effect; introitus tight  Cervix: Normal; parous; no cervical motion tenderness  Uterus: Normal; midplane, small, mobile, nontender  Adnexa: Normal; nonpalpable nontender  RV: External Exam NormaI  MUSCULOSKELETAL: Normal range of motion. No tenderness.  No cyanosis, clubbing, or edema.  2+ distal pulses. LYMPHATIC: No Axillary, Supraclavicular,  or Inguinal Adenopathy.    Assessment:   Annual gynecologic examination 32 y.o. Contraception: abstinence Normal BMI Problem List Items Addressed This Visit    None    Terminal voiding dysuria; urinalysis with trace of blood  Plan:  Pap: due 2021 Mammogram: Not Indicated Stool Guaiac Testing:  Not Indicated Labs: thru employer Routine preventative health maintenance measures emphasized: Exercise/Diet/Weight control, Alcohol/Substance use risks and Safe Sex  Urinalysis and urine culture Continue prenatal vitamins Return to Clinic - 1 Year   Crystal [place], CMA  Herold Harms, MD   Note: This dictation was prepared with Dragon dictation along with smaller phrase technology. Any transcriptional errors that  result from this process are unintentional.

## 2017-10-08 ENCOUNTER — Other Ambulatory Visit: Payer: Self-pay | Admitting: Obstetrics and Gynecology

## 2017-10-08 LAB — URINE CULTURE

## 2017-10-08 MED ORDER — NITROFURANTOIN MONOHYD MACRO 100 MG PO CAPS: 100.0000 mg | ORAL_CAPSULE | Freq: Two times a day (BID) | ORAL | 1 refills | Status: DC

## 2018-07-13 ENCOUNTER — Telehealth: Payer: Self-pay

## 2018-07-13 NOTE — Telephone Encounter (Signed)
Copied from CRM [id number]. Topic: Appointment Scheduling - Scheduling Inquiry for Clinic >> Jul 13, 2018  2:53 PM Marylen Ponto wrote: Reason for CRM: Pt called for an update on whether she should come in for the appt scheduled for 07/19/18. Pt requests call back.

## 2018-07-19 ENCOUNTER — Telehealth: Payer: Self-pay

## 2018-07-19 ENCOUNTER — Encounter: Payer: Managed Care, Other (non HMO) | Admitting: Internal Medicine

## 2018-07-19 ENCOUNTER — Encounter

## 2018-07-19 NOTE — Telephone Encounter (Signed)
Copied from CRM [id number]. Topic: General - Other >> Jul 16, 2018  5:42 PM Richarda Blade wrote: Reason for CRM: Patient was left a voicemail about rescheduling her Physical until June but an opening for Dr. Lorin Picket could not be found in June. She would like a call back for directions on what to do.

## 2018-07-30 ENCOUNTER — Encounter: Payer: Self-pay | Admitting: *Deleted

## 2018-10-08 ENCOUNTER — Other Ambulatory Visit: Payer: Self-pay

## 2018-10-11 ENCOUNTER — Telehealth: Payer: Self-pay

## 2018-10-11 NOTE — Progress Notes (Signed)
Pt present for annual exam. Pt's last pap was 09/18/16. Pt recently pregnancy and is still breastfeeding a daughter. Pt's LMP 08/09/18 not currently sexually activity. Pt stated that she is doing well no problems.

## 2018-10-11 NOTE — Telephone Encounter (Signed)
Pt called no answer LMTRC for prescreening. 

## 2018-10-11 NOTE — Telephone Encounter (Signed)
Pt prescreened no symptoms.   Coronavirus (COVID-19) Are you at risk?  Are you at risk for the Coronavirus (COVID-19)?  To be considered HIGH RISK for Coronavirus (COVID-19), you have to meet the following criteria:  . Traveled to [place], [place], [place], [place] or [place]; or in the [place] to [place], [place], [place], or [place]; and have fever, cough, and shortness of breath within the last 2 weeks of travel OR . Been in close contact with a person diagnosed with COVID-19 within the last 2 weeks and have fever, cough, and shortness of breath . IF YOU DO NOT MEET THESE CRITERIA, YOU ARE CONSIDERED LOW RISK FOR COVID-19.  What to do if you are HIGH RISK for COVID-19?  . If you are having a medical emergency, call 911. . Seek medical care right away. Before you go to a doctor's office, urgent care or emergency department, call ahead and tell them about your recent travel, contact with someone diagnosed with COVID-19, and your symptoms. You should receive instructions from your physician's office regarding next steps of care.  . When you arrive at healthcare provider, tell the healthcare staff immediately you have returned from visiting [place], [place], [place], [place] or [place]; or traveled in the [place] to [place], [place], [place], or [place]; in the last two weeks or you have been in close contact with a person diagnosed with COVID-19 in the last 2 weeks.   . Tell the health care staff about your symptoms: fever, cough and shortness of breath. . After you have been seen by a medical provider, you will be either: o Tested for (COVID-19) and discharged home on quarantine except to seek medical care if symptoms worsen, and asked to  - Stay home and avoid contact with others until you get your results (4-5 days)  - Avoid travel on public transportation if possible (such as bus, train, or airplane) or o Sent to the Emergency Department by EMS for evaluation,  COVID-19 testing, and possible admission depending on your condition and test results.  What to do if you are LOW RISK for COVID-19?  Reduce your risk of any infection by using the same precautions used for avoiding the common cold or flu:  . Wash your hands often with soap and warm water for at least 20 seconds.  If soap and water are not readily available, use an alcohol-based hand sanitizer with at least 60% alcohol.  . If coughing or sneezing, cover your mouth and nose by coughing or sneezing into the elbow areas of your shirt or coat, into a tissue or into your sleeve (not your hands). . Avoid shaking hands with others and consider head nods or verbal greetings only. . Avoid touching your eyes, nose, or mouth with unwashed hands.  . Avoid close contact with people who are sick. . Avoid places or events with large numbers of people in one location, like concerts or sporting events. . Carefully consider travel plans you have or are making. . If you are planning any travel outside or inside the [place], visit the CDC's Travelers' Health webpage for the latest health notices. . If you have some symptoms but not all symptoms, continue to monitor at home and seek medical attention if your symptoms worsen. . If you are having a medical emergency, call 911.   ADDITIONAL HEALTHCARE OPTIONS FOR PATIENTS  [facility] / e-Visit: [email or web address]         [facility]   [facility]: [phone]  [facility]: [phone]                   [facility]: [phone]  

## 2018-10-11 NOTE — Patient Instructions (Addendum)
Health Maintenance, Female Adopting a healthy lifestyle and getting preventive care are important in promoting health and wellness. Ask your health care provider about:  The right schedule for you to have regular tests and exams.  Things you can do on your own to prevent diseases and keep yourself healthy. What should I know about diet, weight, and exercise? Eat a healthy diet   Eat a diet that includes plenty of vegetables, fruits, low-fat dairy products, and lean protein.  Do not eat a lot of foods that are high in solid fats, added sugars, or sodium. Maintain a healthy weight Body mass index (BMI) is used to identify weight problems. It estimates body fat based on height and weight. Your health care provider can help determine your BMI and help you achieve or maintain a healthy weight. Get regular exercise Get regular exercise. This is one of the most important things you can do for your health. Most adults should:  Exercise for at least 150 minutes each week. The exercise should increase your heart rate and make you sweat (moderate-intensity exercise).  Do strengthening exercises at least twice a week. This is in addition to the moderate-intensity exercise.  Spend less time sitting. Even light physical activity can be beneficial. Watch cholesterol and blood lipids Have your blood tested for lipids and cholesterol at 33 years of age, then have this test every 5 years. Have your cholesterol levels checked more often if:  Your lipid or cholesterol levels are high.  You are older than 33 years of age.  You are at high risk for heart disease. What should I know about cancer screening? Depending on your health history and family history, you may need to have cancer screening at various ages. This may include screening for:  Breast cancer.  Cervical cancer.  Colorectal cancer.  Skin cancer.  Lung cancer. What should I know about heart disease, diabetes, and high blood  pressure? Blood pressure and heart disease  High blood pressure causes heart disease and increases the risk of stroke. This is more likely to develop in people who have high blood pressure readings, are of African descent, or are overweight.  Have your blood pressure checked: ? Every 3-5 years if you are 18-39 years of age. ? Every year if you are 40 years old or older. Diabetes Have regular diabetes screenings. This checks your fasting blood sugar level. Have the screening done:  Once every three years after age 40 if you are at a normal weight and have a low risk for diabetes.  More often and at a younger age if you are overweight or have a high risk for diabetes. What should I know about preventing infection? Hepatitis B If you have a higher risk for hepatitis B, you should be screened for this virus. Talk with your health care provider to find out if you are at risk for hepatitis B infection. Hepatitis C Testing is recommended for:  Everyone born from 1945 through 1965.  Anyone with known risk factors for hepatitis C. Sexually transmitted infections (STIs)  Get screened for STIs, including gonorrhea and chlamydia, if: ? You are sexually active and are younger than 33 years of age. ? You are older than 33 years of age and your health care provider tells you that you are at risk for this type of infection. ? Your sexual activity has changed since you were last screened, and you are at increased risk for chlamydia or gonorrhea. Ask your health care provider if   you are at risk.  Ask your health care provider about whether you are at high risk for HIV. Your health care provider may recommend a prescription medicine to help prevent HIV infection. If you choose to take medicine to prevent HIV, you should first get tested for HIV. You should then be tested every 3 months for as long as you are taking the medicine. Pregnancy  If you are about to stop having your period (premenopausal) and  you may become pregnant, seek counseling before you get pregnant.  Take 400 to 800 micrograms (mcg) of folic acid every day if you become pregnant.  Ask for birth control (contraception) if you want to prevent pregnancy. Osteoporosis and menopause Osteoporosis is a disease in which the bones lose minerals and strength with aging. This can result in bone fractures. If you are 65 years old or older, or if you are at risk for osteoporosis and fractures, ask your health care provider if you should:  Be screened for bone loss.  Take a calcium or vitamin D supplement to lower your risk of fractures.  Be given hormone replacement therapy (HRT) to treat symptoms of menopause. Follow these instructions at home: Lifestyle  Do not use any products that contain nicotine or tobacco, such as cigarettes, e-cigarettes, and chewing tobacco. If you need help quitting, ask your health care provider.  Do not use street drugs.  Do not share needles.  Ask your health care provider for help if you need support or information about quitting drugs. Alcohol use  Do not drink alcohol if: ? Your health care provider tells you not to drink. ? You are pregnant, may be pregnant, or are planning to become pregnant.  If you drink alcohol: ? Limit how much you use to 0-1 drink a day. ? Limit intake if you are breastfeeding.  Be aware of how much alcohol is in your drink. In the [place] one drink equals one 12 oz bottle of beer (355 mL), one 5 oz glass of wine (148 mL), or one 1 oz glass of hard liquor (44 mL). General instructions  Schedule regular health, dental, and eye exams.  Stay current with your vaccines.  Tell your health care provider if: ? You often feel depressed. ? You have ever been abused or do not feel safe at home. Summary  Adopting a healthy lifestyle and getting preventive care are important in promoting health and wellness.  Follow your health care provider's instructions about healthy  diet, exercising, and getting tested or screened for diseases.  Follow your health care provider's instructions on monitoring your cholesterol and blood pressure. This information is not intended to replace advice given to you by your health care provider. Make sure you discuss any questions you have with your health care provider. Document Released: 10/14/2010 Document Revised: 03/24/2018 Document Reviewed: 03/24/2018 Elsevier Patient Education  2020 [employer] Inc[employer]   Breast Self-Awareness Breast self-awareness is knowing how your breasts look and feel. Doing breast self-awareness is important. It allows you to catch a breast problem early while it is still small and can be treated. All women should do breast self-awareness, including women who have had breast implants. Tell your doctor if you notice a change in your breasts. What you need:  A mirror.  A well-lit room. How to do a breast self-exam A breast self-exam is one way to learn what is normal for your breasts and to check for changes. To do a breast self-exam: Look for changes  1.   Take off all the clothes above your waist. 2. Stand in front of a mirror in a room with good lighting. 3. Put your hands on your hips. 4. Push your hands down. 5. Look at your breasts and nipples in the mirror to see if one breast or nipple looks different from the other. Check to see if: ? The shape of one breast is different. ? The size of one breast is different. ? There are wrinkles, dips, and bumps in one breast and not the other. 6. Look at each breast for changes in the skin, such as: ? Redness. ? Scaly areas. 7. Look for changes in your nipples, such as: ? Liquid around the nipples. ? Bleeding. ? Dimpling. ? Redness. ? A change in where the nipples are. Feel for changes  1. Lie on your back on the floor. 2. Feel each breast. To do this, follow these steps: ? Pick a breast to feel. ? Put the arm closest to that breast above your head.  ? Use your other arm to feel the nipple area of your breast. Feel the area with the pads of your three middle fingers by making small circles with your fingers. For the first circle, press lightly. For the second circle, press harder. For the third circle, press even harder. ? Keep making circles with your fingers at the different pressures as you move down your breast. Stop when you feel your ribs. ? Move your fingers a little toward the center of your body. ? Start making circles with your fingers again, this time going up until you reach your collarbone. ? Keep making up-and-down circles until you reach your armpit. Remember to keep using the three pressures. ? Feel the other breast in the same way. 3. Sit or stand in the tub or shower. 4. With soapy water on your skin, feel each breast the same way you did in step 2 when you were lying on the floor. Write down what you find Writing down what you find can help you remember what to tell your doctor. Write down:  What is normal for each breast.  Any changes you find in each breast, including: ? The kind of changes you find. ? Whether you have pain. ? Size and location of any lumps.  When you last had your menstrual period. General tips  Check your breasts every month.  If you are breastfeeding, the best time to check your breasts is after you feed your baby or after you use a breast pump.  If you get menstrual periods, the best time to check your breasts is 5-7 days after your menstrual period is over.  With time, you will become comfortable with the self-exam, and you will begin to know if there are changes in your breasts. Contact a doctor if you:  See a change in the shape or size of your breasts or nipples.  See a change in the skin of your breast or nipples, such as red or scaly skin.  Have fluid coming from your nipples that is not normal.  Find a lump or thick area that was not there before.  Have pain in your breasts.   Have any concerns about your breast health. Summary  Breast self-awareness includes looking for changes in your breasts, as well as feeling for changes within your breasts.  Breast self-awareness should be done in front of a mirror in a well-lit room.  You should check your breasts every month. If you get menstrual   periods, the best time to check your breasts is 5-7 days after your menstrual period is over.  Let your doctor know of any changes you see in your breasts, including changes in size, changes on the skin, pain or tenderness, or fluid from your nipples that is not normal. This information is not intended to replace advice given to you by your health care provider. Make sure you discuss any questions you have with your health care provider. Document Released: 09/17/2007 Document Revised: 11/17/2017 Document Reviewed: 11/17/2017 Elsevier Patient Education  2020 Elsevier Inc.  

## 2018-10-12 ENCOUNTER — Encounter: Payer: Self-pay | Admitting: Obstetrics and Gynecology

## 2018-10-12 ENCOUNTER — Ambulatory Visit (INDEPENDENT_AMBULATORY_CARE_PROVIDER_SITE_OTHER): Payer: Managed Care, Other (non HMO) | Admitting: Obstetrics and Gynecology

## 2018-10-12 ENCOUNTER — Ambulatory Visit (INDEPENDENT_AMBULATORY_CARE_PROVIDER_SITE_OTHER): Payer: Managed Care, Other (non HMO) | Admitting: Internal Medicine

## 2018-10-12 ENCOUNTER — Other Ambulatory Visit: Payer: Self-pay

## 2018-10-12 ENCOUNTER — Encounter: Payer: Self-pay | Admitting: Internal Medicine

## 2018-10-12 ENCOUNTER — Encounter: Payer: Managed Care, Other (non HMO) | Admitting: Obstetrics and Gynecology

## 2018-10-12 VITALS — BP 98/70 | HR 76 | Temp 98.8°F | Resp 16 | Ht 64.0 in | Wt 146.0 lb

## 2018-10-12 VITALS — BP 98/64 | HR 80 | Ht 64.0 in | Wt 145.9 lb

## 2018-10-12 DIAGNOSIS — Z13220 Encounter for screening for lipoid disorders: Secondary | ICD-10-CM | POA: Diagnosis not present

## 2018-10-12 DIAGNOSIS — Z0000 Encounter for general adult medical examination without abnormal findings: Secondary | ICD-10-CM | POA: Diagnosis not present

## 2018-10-12 DIAGNOSIS — R87619 Unspecified abnormal cytological findings in specimens from cervix uteri: Secondary | ICD-10-CM | POA: Diagnosis not present

## 2018-10-12 DIAGNOSIS — N926 Irregular menstruation, unspecified: Secondary | ICD-10-CM | POA: Diagnosis not present

## 2018-10-12 DIAGNOSIS — F439 Reaction to severe stress, unspecified: Secondary | ICD-10-CM | POA: Diagnosis not present

## 2018-10-12 DIAGNOSIS — Z01419 Encounter for gynecological examination (general) (routine) without abnormal findings: Secondary | ICD-10-CM

## 2018-10-12 NOTE — Progress Notes (Signed)
GYNECOLOGY ANNUAL PHYSICAL EXAM PROGRESS NOTE  Subjective:    Mikayla Brown is a 33 y.o. G64P1001 female who presents for an annual exam. The patient has no complaints today. The patient is not currentlysexually active. The patient wears seatbelts: yes. The patient participates in regular exercise: no. Has the patient ever been transfused or tattooed?: no. The patient reports that there is not domestic violence in her life.    Gynecologic History  Menarche age: 70 Patient's last menstrual period was 08/09/2018.  Notes irregular cycles as she is still breastfeeding.  Infant is 57 months old.  Contraception: abstinence currently.  Just ended a relationship several months ago, was using condoms. History of STI's: Denies Last Pap: 09/18/2016. Results were: normal.  Reports remote h/o abnormal pap smears (h/o LEEP, 5-6 years ago). PCP: Dr. Einar Pheasant [facility]).    OB History  Gravida Para Term Preterm AB Living  1 1 1  0 0 1  SAB TAB Ectopic Multiple Live Births  0 0 0 0 1    # Outcome Date GA Lbr Len/2nd Weight Sex Delivery Anes PTL Lv  1 Term 06/12/17 [redacted]w[redacted]d / 02:13 8 lb 11.7 oz (3.96 kg) F Vag-Spont EPI  LIV     Name: Holst,GIRL Annessa     Apgar1: 7  Apgar5: 9    Past Medical History:  Diagnosis Date  . Acne    followed by dermatology  . Acne   . Anxiety   . Esophagitis   . GERD (gastroesophageal reflux disease)   . Heavy menses   . HPV test positive   . Painful menstruation   . Rosacea   . Stress   . Weight loss     Past Surgical History:  Procedure Laterality Date  . EYE SURGERY    . LEEP      Family History  Problem Relation Age of Onset  . Thyroid disease Mother   . Breast cancer Maternal Grandmother   . Stomach cancer Maternal Grandmother   . Stomach cancer Other   . Colon cancer Neg Hx   . Diabetes Neg Hx   . Heart disease Neg Hx   . Ovarian cancer Neg Hx     Social History   Socioeconomic History  . Marital status: Single     Spouse name: Not on file  . Number of children: 0  . Years of education: Not on file  . Highest education level: Not on file  Occupational History    Employer: [employer]  . Financial resource strain: Not on file  . Food insecurity    Worry: Not on file    Inability: Not on file  . Transportation needs    Medical: Not on file    Non-medical: Not on file  Tobacco Use  . Smoking status: Never Smoker  . Smokeless tobacco: Never Used  Substance and Sexual Activity  . Alcohol use: Yes    Alcohol/week: 0.0 standard drinks    Comment: occasional  . Drug use: No    Types: Marijuana    Comment: occas/nearly stopped  . Sexual activity: Not Currently    Birth control/protection: None  Lifestyle  . Physical activity    Days per week: 0 days    Minutes per session: 0 min  . Stress: Not on file  Relationships  . Social Herbalist on phone: Not on file    Gets together: Not on file    Attends religious service:  Not on file    Active member of club or organization: Not on file    Attends meetings of clubs or organizations: Not on file    Relationship status: Not on file  . Intimate partner violence    Fear of current or ex partner: Not on file    Emotionally abused: Not on file    Physically abused: Not on file    Forced sexual activity: Not on file  Other Topics Concern  . Not on file  Social History Narrative  . Not on file    Current Outpatient Medications on File Prior to Visit  Medication Sig Dispense Refill  . Prenatal Vit-Fe Fumarate-FA (MULTIVITAMIN-PRENATAL) 27-0.8 MG TABS tablet Take 1 tablet by mouth daily at 12 noon.     No current facility-administered medications on file prior to visit.     Allergies  Allergen Reactions  . Penicillins   . Sulfa Antibiotics      Review of Systems Constitutional: negative for chills, fatigue, fevers and sweats Eyes: negative for irritation, redness and visual disturbance Ears, nose, mouth, throat,  and face: negative for hearing loss, nasal congestion, snoring and tinnitus Respiratory: negative for asthma, cough, sputum Cardiovascular: negative for chest pain, dyspnea, exertional chest pressure/discomfort, irregular heart beat, palpitations and syncope Gastrointestinal: negative for abdominal pain, change in bowel habits, nausea and vomiting Genitourinary: positive for irregular menstrual periods, negative genital lesions, sexual problems and vaginal discharge, dysuria and urinary incontinence Integument/breast: negative for breast lump, breast tenderness and nipple discharge Hematologic/lymphatic: negative for bleeding and easy bruising Musculoskeletal:negative for back pain and muscle weakness Neurological: negative for dizziness, headaches, vertigo and weakness Endocrine: negative for diabetic symptoms including polydipsia, polyuria and skin dryness Allergic/Immunologic: negative for hay fever and urticaria       Objective:  Blood pressure 98/64, pulse 80, height 5\' 4"  (1.626 m), weight 145 lb 14.4 oz (66.2 kg), last menstrual period 08/09/2018, currently breastfeeding. Body mass index is 25.04 kg/m.   General Appearance:    Alert, cooperative, no distress, appears stated age  Head:    Normocephalic, without obvious abnormality, atraumatic  Eyes:    PERRL, conjunctiva/corneas clear, EOM's intact, both eyes  Ears:    Normal external ear canals, both ears  Nose:   Nares normal, septum midline, mucosa normal, no drainage or sinus tenderness  Throat:   Lips, mucosa, and tongue normal; teeth and gums normal  Neck:   Supple, symmetrical, trachea midline, no adenopathy; thyroid: no enlargement/tenderness/nodules; no carotid bruit or JVD  Back:     Symmetric, no curvature, ROM normal, no CVA tenderness  Lungs:     Clear to auscultation bilaterally, respirations unlabored  Chest Wall:    No tenderness or deformity   Heart:    Regular rate and rhythm, S1 and S2 normal, no murmur, rub or  gallop  Breast Exam:    No tenderness, masses, or nipple abnormality  Abdomen:     Soft, non-tender, bowel sounds active all four quadrants, no masses, no organomegaly.    Genitalia:    Pelvic:external genitalia normal, vagina without lesions, discharge, or tenderness, rectovaginal septum  normal. Cervix normal in appearance, no cervical motion tenderness, no adnexal masses or tenderness.  Uterus normal size, shape, mobile, regular contours, nontender.  Rectal:    Normal external sphincter.  No hemorrhoids appreciated. Internal exam not done.   Extremities:   Extremities normal, atraumatic, no cyanosis or edema  Pulses:   2+ and symmetric all extremities  Skin:   Skin  color, texture, turgor normal, no rashes or lesions  Lymph nodes:   Cervical, supraclavicular, and axillary nodes normal  Neurologic:   CNII-XII intact, normal strength, sensation and reflexes throughout   .  Labs:  Lab Results  Component Value Date   WBC 12.7 (H) 06/13/2017   HGB 11.5 (L) 06/13/2017   HCT 33.9 (L) 06/13/2017   MCV 89.1 06/13/2017   PLT 211 06/13/2017    Lab Results  Component Value Date   CREATININE 0.64 08/21/2016   BUN 8 08/21/2016   NA 140 08/21/2016   K 4.1 08/21/2016   CL 100 08/21/2016   CO2 24 08/21/2016    Lab Results  Component Value Date   ALT 9 08/21/2016   AST 12 08/21/2016   ALKPHOS 55 08/21/2016   BILITOT 0.7 08/21/2016    Lab Results  Component Value Date   TSH 0.681 08/21/2016    Assessment:    Healthy female exam.  Lactating mother Irregular menses  Plan:     Blood tests: None ordered. Has appt with PCP today as well for annual physical. Breast self exam technique reviewed and patient encouraged to perform self-exam monthly. Contraception: abstinence. Discussed healthy lifestyle modifications. Pap smear up to date. Next due in 2021.  H/o abnormal pap smears with LEEP in the past.  Irregular menses likely secondary to patient continuing to breastfeed. Not  currently sexually active.  Follow up in 1 year.     Hildred Laserherry, Avalin Briley, MD [facility]

## 2018-10-12 NOTE — Progress Notes (Signed)
Patient ID: Aurelio JewCatherine Giel, female   DOB: [birth year], 33 y.o.   MRN: [id number]   Subjective:    Patient ID: Aurelio JewCatherine Luby, female    DOB: [birth year], 33 y.o.   MRN: [id number]  HPI  Patient here for a scheduled physical.  She just saw gyn this am.  Due pap next year.  She is doing well.  Feels good.  Stays active.  Working.  Wearing a mask. Taking care of her daughter.  Parents helping.  Very happy.  Eating well.  No acid reflux.  No abdominal pain.  Bowels moving.  No chest pain, chest congestion or sob.  No fever.     Past Medical History:  Diagnosis Date  . Acne    followed by dermatology  . Acne   . Anxiety   . Esophagitis   . GERD (gastroesophageal reflux disease)   . Heavy menses   . HPV test positive   . Painful menstruation   . Rosacea   . Stress   . Weight loss    Past Surgical History:  Procedure Laterality Date  . EYE SURGERY    . LEEP     Family History  Problem Relation Age of Onset  . Thyroid disease Mother   . Breast cancer Maternal Grandmother   . Stomach cancer Maternal Grandmother   . Stomach cancer Other   . Colon cancer Neg Hx   . Diabetes Neg Hx   . Heart disease Neg Hx   . Ovarian cancer Neg Hx    Social History   Socioeconomic History  . Marital status: Single    Spouse name: Not on file  . Number of children: 0  . Years of education: Not on file  . Highest education level: Not on file  Occupational History    Employer: [employer]  Social Needs  . Financial resource strain: Not on file  . Food insecurity    Worry: Not on file    Inability: Not on file  . Transportation needs    Medical: Not on file    Non-medical: Not on file  Tobacco Use  . Smoking status: Never Smoker  . Smokeless tobacco: Never Used  Substance and Sexual Activity  . Alcohol use: Yes    Alcohol/week: 0.0 standard drinks    Comment: occasional  . Drug use: No    Types: Marijuana    Comment: occas/nearly stopped  . Sexual activity: Not Currently    Birth  control/protection: None  Lifestyle  . Physical activity    Days per week: 0 days    Minutes per session: 0 min  . Stress: Not on file  Relationships  . Social Musicianconnections    Talks on phone: Not on file    Gets together: Not on file    Attends religious service: Not on file    Active member of club or organization: Not on file    Attends meetings of clubs or organizations: Not on file    Relationship status: Not on file  Other Topics Concern  . Not on file  Social History Narrative  . Not on file    Outpatient Encounter Medications as of 10/12/2018  Medication Sig  . Prenatal Vit-Fe Fumarate-FA (MULTIVITAMIN-PRENATAL) 27-0.8 MG TABS tablet Take 1 tablet by mouth daily at 12 noon.  . [DISCONTINUED] nitrofurantoin, macrocrystal-monohydrate, (MACROBID) 100 MG capsule Take 1 capsule (100 mg total) by mouth 2 (two) times daily.   No facility-administered encounter medications on file as of 10/12/2018.  Review of Systems  Constitutional: Negative for appetite change and unexpected weight change.  HENT: Negative for congestion and sinus pressure.   Eyes: Negative for pain and visual disturbance.  Respiratory: Negative for cough, chest tightness and shortness of breath.   Cardiovascular: Negative for chest pain, palpitations and leg swelling.  Gastrointestinal: Negative for abdominal pain, diarrhea, nausea and vomiting.  Genitourinary: Negative for difficulty urinating and dysuria.  Musculoskeletal: Negative for joint swelling and myalgias.  Skin: Negative for color change and rash.  Neurological: Negative for dizziness, light-headedness and headaches.  Hematological: Negative for adenopathy. Does not bruise/bleed easily.  Psychiatric/Behavioral: Negative for agitation and dysphoric mood.       Objective:    Physical Exam Constitutional:      General: She is not in acute distress.    Appearance: Normal appearance. She is well-developed.  HENT:     Right Ear: External ear  normal.     Left Ear: External ear normal.  Eyes:     General: No scleral icterus.       Right eye: No discharge.        Left eye: No discharge.     Conjunctiva/sclera: Conjunctivae normal.  Neck:     Musculoskeletal: Neck supple. No muscular tenderness.     Thyroid: No thyromegaly.  Cardiovascular:     Rate and Rhythm: Normal rate and regular rhythm.  Pulmonary:     Effort: No tachypnea, accessory muscle usage or respiratory distress.     Breath sounds: Normal breath sounds. No decreased breath sounds or wheezing.  Chest:     Breasts:        Right: No inverted nipple, mass, nipple discharge or tenderness (no axillary adenopathy).        Left: No inverted nipple, mass, nipple discharge or tenderness (no axilarry adenopathy).  Abdominal:     General: Bowel sounds are normal.     Palpations: Abdomen is soft.     Tenderness: There is no abdominal tenderness.  Genitourinary:    Comments: Followed by gyn.  Musculoskeletal:        General: No swelling or tenderness.  Lymphadenopathy:     Cervical: No cervical adenopathy.  Skin:    Findings: No erythema or rash.  Neurological:     Mental Status: She is alert and oriented to person, place, and time.  Psychiatric:        Mood and Affect: Mood normal.        Behavior: Behavior normal.     BP 98/70   Pulse 76   Temp 98.8 F (37.1 C) (Oral)   Resp 16   Ht 5\' 4"  (1.626 m)   Wt 146 lb (66.2 kg)   SpO2 97%   BMI 25.06 kg/m  Wt Readings from Last 3 Encounters:  10/12/18 146 lb (66.2 kg)  10/12/18 145 lb 14.4 oz (66.2 kg)  10/06/17 146 lb 3.2 oz (66.3 kg)     Lab Results  Component Value Date   WBC 12.7 (H) 06/13/2017   HGB 11.5 (L) 06/13/2017   HCT 33.9 (L) 06/13/2017   PLT 211 06/13/2017   GLUCOSE 81 08/21/2016   CHOL 140 08/21/2016   TRIG 40 08/21/2016   HDL 61 08/21/2016   LDLCALC 71 08/21/2016   ALT 9 08/21/2016   AST 12 08/21/2016   NA 140 08/21/2016   K 4.1 08/21/2016   CL 100 08/21/2016   CREATININE 0.64  08/21/2016   BUN 8 08/21/2016   CO2 24 08/21/2016  TSH 0.681 08/21/2016       Assessment & Plan:   Problem List Items Addressed This Visit    Abnormal Pap smear of cervix    Followed by gyn.  Evaluated this am.        Health care maintenance    Physical today 10/12/18. Followed by gyn for pelvic/pap.        Relevant Orders   SAR CoV2 Serology (COVID 19)AB(IGG)IA   Stress    Doing better.  Feels better.  Overall doing well.  Follow.         Other Visit Diagnoses    Screening cholesterol level    -  Primary   Relevant Orders   CBC with Differential/Platelet   Comprehensive metabolic panel   Lipid panel   TSH       Einar Pheasant, MD

## 2018-10-16 ENCOUNTER — Encounter: Payer: Self-pay | Admitting: Internal Medicine

## 2018-10-16 NOTE — Assessment & Plan Note (Signed)
Followed by gyn.  Evaluated this am.

## 2018-10-16 NOTE — Assessment & Plan Note (Signed)
Physical today 10/12/18. Followed by gyn for pelvic/pap.

## 2018-10-16 NOTE — Assessment & Plan Note (Signed)
Doing better.  Feels better.  Overall doing well.  Follow.

## 2018-11-16 ENCOUNTER — Encounter: Payer: Self-pay | Admitting: Internal Medicine

## 2018-11-16 LAB — CBC WITH DIFFERENTIAL/PLATELET
Basophils Absolute: 0.1 10*3/uL (ref 0.0–0.2)
Basos: 1 %
EOS (ABSOLUTE): 0.5 10*3/uL — ABNORMAL HIGH (ref 0.0–0.4)
Eos: 8 %
Hematocrit: 42.7 % (ref 34.0–46.6)
Hemoglobin: 14 g/dL (ref 11.1–15.9)
Immature Grans (Abs): 0 10*3/uL (ref 0.0–0.1)
Immature Granulocytes: 0 %
Lymphocytes Absolute: 2.2 10*3/uL (ref 0.7–3.1)
Lymphs: 33 %
MCH: 28.7 pg (ref 26.6–33.0)
MCHC: 32.8 g/dL (ref 31.5–35.7)
MCV: 88 fL (ref 79–97)
Monocytes Absolute: 0.5 10*3/uL (ref 0.1–0.9)
Monocytes: 7 %
Neutrophils Absolute: 3.5 10*3/uL (ref 1.4–7.0)
Neutrophils: 51 %
Platelets: 236 10*3/uL (ref 150–450)
RBC: 4.88 x10E6/uL (ref 3.77–5.28)
RDW: 12.2 % (ref 11.7–15.4)
WBC: 6.9 10*3/uL (ref 3.4–10.8)

## 2018-11-16 LAB — COMPREHENSIVE METABOLIC PANEL
ALT: 10 IU/L (ref 0–32)
AST: 12 IU/L (ref 0–40)
Albumin/Globulin Ratio: 1.7 (ref 1.2–2.2)
Albumin: 4.3 g/dL (ref 3.8–4.8)
Alkaline Phosphatase: 74 IU/L (ref 39–117)
BUN/Creatinine Ratio: 13 (ref 9–23)
BUN: 9 mg/dL (ref 6–20)
Bilirubin Total: 0.9 mg/dL (ref 0.0–1.2)
CO2: 25 mmol/L (ref 20–29)
Calcium: 8.8 mg/dL (ref 8.7–10.2)
Chloride: 103 mmol/L (ref 96–106)
Creatinine, Ser: 0.7 mg/dL (ref 0.57–1.00)
GFR calc Af Amer: 132 mL/min/{1.73_m2} (ref >59)
GFR calc non Af Amer: 114 mL/min/{1.73_m2} (ref >59)
Globulin, Total: 2.6 g/dL (ref 1.5–4.5)
Glucose: 84 mg/dL (ref 65–99)
Potassium: 4.1 mmol/L (ref 3.5–5.2)
Sodium: 141 mmol/L (ref 134–144)
Total Protein: 6.9 g/dL (ref 6.0–8.5)

## 2018-11-16 LAB — LIPID PANEL
Chol/HDL Ratio: 2.4 ratio (ref 0.0–4.4)
Cholesterol, Total: 159 mg/dL (ref 100–199)
HDL: 65 mg/dL (ref >39)
LDL Calculated: 85 mg/dL (ref 0–99)
Triglycerides: 47 mg/dL (ref 0–149)
VLDL Cholesterol Cal: 9 mg/dL (ref 5–40)

## 2018-11-16 LAB — SAR COV2 SEROLOGY (COVID19)AB(IGG),IA: SARS-CoV-2 Ab, IgG: NEGATIVE

## 2018-11-16 LAB — TSH: TSH: 1 u[IU]/mL (ref 0.450–4.500)

## 2019-10-13 ENCOUNTER — Encounter: Payer: Managed Care, Other (non HMO) | Admitting: Obstetrics and Gynecology

## 2019-10-13 ENCOUNTER — Other Ambulatory Visit: Payer: Self-pay

## 2019-10-13 ENCOUNTER — Ambulatory Visit (INDEPENDENT_AMBULATORY_CARE_PROVIDER_SITE_OTHER): Payer: Managed Care, Other (non HMO) | Admitting: Internal Medicine

## 2019-10-13 VITALS — BP 102/68 | HR 87 | Temp 97.8°F | Resp 16 | Ht 64.0 in | Wt 149.6 lb

## 2019-10-13 DIAGNOSIS — Z9889 Other specified postprocedural states: Secondary | ICD-10-CM

## 2019-10-13 DIAGNOSIS — F439 Reaction to severe stress, unspecified: Secondary | ICD-10-CM

## 2019-10-13 DIAGNOSIS — Z0000 Encounter for general adult medical examination without abnormal findings: Secondary | ICD-10-CM

## 2019-10-13 NOTE — Progress Notes (Signed)
Patient ID: Mikayla Brown, female   DOB: 11/26/1985, 34 y.o.   MRN: [id number]   Subjective:    Patient ID: Mikayla Brown, female    DOB: 05/07/1985, 34 y.o.   MRN: [id number]  HPI This visit occurred during the SARS-CoV-2 public health emergency.  Safety protocols were in place, including screening questions prior to the visit, additional usage of staff PPE, and extensive cleaning of exam room while observing appropriate contact time as indicated for disinfecting solutions.  Patient here for her physical exam.  Sees Dr Valentino Saxon.  Planning to f/u with her tomorrow for gyn exam.  Some increased stress with her daughter and potty training and her bowels.  Discussed with her today.  Has good support.  Overall appears to be handling things well.  She is trying to stay active.  No chest pain or sob reported.  No abdominal pain or bowel change reported.  Overall she feels she is doing relatively well.    Past Medical History:  Diagnosis Date  . Acne    followed by dermatology  . Acne   . Anxiety   . Esophagitis   . GERD (gastroesophageal reflux disease)   . Heavy menses   . HPV test positive   . Painful menstruation   . Rosacea   . Stress   . Weight loss    Past Surgical History:  Procedure Laterality Date  . EYE SURGERY    . LEEP     Family History  Problem Relation Age of Onset  . Thyroid disease Mother   . Breast cancer Maternal Grandmother   . Stomach cancer Maternal Grandmother   . Stomach cancer Other   . Colon cancer Neg Hx   . Diabetes Neg Hx   . Heart disease Neg Hx   . Ovarian cancer Neg Hx    Social History   Socioeconomic History  . Marital status: Single    Spouse name: Not on file  . Number of children: 0  . Years of education: Not on file  . Highest education level: Not on file  Occupational History    Employer: [employer]  Tobacco Use  . Smoking status: Never Smoker  . Smokeless tobacco: Never Used  Vaping Use  . Vaping Use: Never used  Substance and  Sexual Activity  . Alcohol use: Yes    Alcohol/week: 0.0 standard drinks    Comment: occasional  . Drug use: No    Types: Marijuana    Comment: occas/nearly stopped  . Sexual activity: Not Currently    Birth control/protection: None  Other Topics Concern  . Not on file  Social History Narrative  . Not on file   Social Determinants of Health   Financial Resource Strain:   . Difficulty of Paying Living Expenses:   Food Insecurity:   . Worried About Programme researcher, broadcasting/film/video in the Last Year:   . Barista in the Last Year:   Transportation Needs:   . Freight forwarder (Medical):   Marland Kitchen Lack of Transportation (Non-Medical):   Physical Activity:   . Days of Exercise per Week:   . Minutes of Exercise per Session:   Stress:   . Feeling of Stress :   Social Connections:   . Frequency of Communication with Friends and Family:   . Frequency of Social Gatherings with Friends and Family:   . Attends Religious Services:   . Active Member of Clubs or Organizations:   . Attends Banker  Meetings:   Marland Kitchen Marital Status:     Outpatient Encounter Medications as of 10/13/2019  Medication Sig  . [DISCONTINUED] Prenatal Vit-Fe Fumarate-FA (MULTIVITAMIN-PRENATAL) 27-0.8 MG TABS tablet Take 1 tablet by mouth daily at 12 noon.   No facility-administered encounter medications on file as of 10/13/2019.    Review of Systems  Constitutional: Negative for appetite change and unexpected weight change.  HENT: Negative for congestion and sinus pressure.   Eyes: Negative for pain and visual disturbance.  Respiratory: Negative for cough, chest tightness and shortness of breath.   Cardiovascular: Negative for chest pain, palpitations and leg swelling.  Gastrointestinal: Negative for abdominal pain, diarrhea, nausea and vomiting.  Genitourinary: Negative for difficulty urinating and dysuria.  Musculoskeletal: Negative for joint swelling and myalgias.  Skin: Negative for color change and  rash.  Neurological: Negative for dizziness, light-headedness and headaches.  Hematological: Negative for adenopathy. Does not bruise/bleed easily.  Psychiatric/Behavioral: Negative for agitation and dysphoric mood.       Increased stress as outlined.         Objective:    Physical Exam Vitals reviewed.  Constitutional:      General: She is not in acute distress.    Appearance: Normal appearance. She is well-developed.  HENT:     Head: Normocephalic and atraumatic.     Right Ear: External ear normal.     Left Ear: External ear normal.  Eyes:     General: No scleral icterus.       Right eye: No discharge.        Left eye: No discharge.     Conjunctiva/sclera: Conjunctivae normal.  Neck:     Thyroid: No thyromegaly.  Cardiovascular:     Rate and Rhythm: Normal rate and regular rhythm.  Pulmonary:     Effort: No tachypnea, accessory muscle usage or respiratory distress.     Breath sounds: Normal breath sounds. No decreased breath sounds or wheezing.  Chest:     Breasts:        Right: No inverted nipple, mass, nipple discharge or tenderness (no axillary adenopathy).        Left: No inverted nipple, mass, nipple discharge or tenderness (no axilarry adenopathy).  Abdominal:     General: Bowel sounds are normal.     Palpations: Abdomen is soft.     Tenderness: There is no abdominal tenderness.  Musculoskeletal:        General: No swelling or tenderness.     Cervical back: Neck supple. No tenderness.  Lymphadenopathy:     Cervical: No cervical adenopathy.  Skin:    Findings: No erythema or rash.  Neurological:     Mental Status: She is alert and oriented to person, place, and time.  Psychiatric:        Mood and Affect: Mood normal.        Behavior: Behavior normal.     BP 102/68   Pulse 87   Temp 97.8 F (36.6 C)   Resp 16   Ht 5\' 4"  (1.626 m)   Wt 149 lb 9.6 oz (67.9 kg)   SpO2 99%   BMI 25.68 kg/m  Wt Readings from Last 3 Encounters:  10/14/19 150 lb 11.2 oz  (68.4 kg)  10/13/19 149 lb 9.6 oz (67.9 kg)  10/12/18 146 lb (66.2 kg)     Lab Results  Component Value Date   WBC 6.9 11/15/2018   HGB 14.0 11/15/2018   HCT 42.7 11/15/2018   PLT 236 11/15/2018  GLUCOSE 84 11/15/2018   CHOL 159 11/15/2018   TRIG 47 11/15/2018   HDL 65 11/15/2018   LDLCALC 85 11/15/2018   ALT 10 11/15/2018   AST 12 11/15/2018   NA 141 11/15/2018   K 4.1 11/15/2018   CL 103 11/15/2018   CREATININE 0.70 11/15/2018   BUN 9 11/15/2018   CO2 25 11/15/2018   TSH 1.000 11/15/2018       Assessment & Plan:   Problem List Items Addressed This Visit    Health care maintenance    Physical today - 10/13/19.  Followed by gyn for pelvic exams.        History of loop electrical excision procedure (LEEP)    Followed by GYN.  Has appt tomorrow.        Stress    Increased stress as outlined.  Discussed with her today.  Has good support.  Overall handling things relatively well.  Follow.            Dale [place], MD

## 2019-10-14 ENCOUNTER — Ambulatory Visit (INDEPENDENT_AMBULATORY_CARE_PROVIDER_SITE_OTHER): Payer: Managed Care, Other (non HMO) | Admitting: Obstetrics and Gynecology

## 2019-10-14 ENCOUNTER — Encounter: Payer: Self-pay | Admitting: Obstetrics and Gynecology

## 2019-10-14 ENCOUNTER — Other Ambulatory Visit (HOSPITAL_COMMUNITY)
Admission: RE | Admit: 2019-10-14 | Discharge: 2019-10-14 | Disposition: A | Discharge status: Home / Self Care | Payer: Managed Care, Other (non HMO) | Source: Ambulatory Visit | Attending: Obstetrics and Gynecology | Admitting: Obstetrics and Gynecology

## 2019-10-14 VITALS — BP 94/65 | HR 76 | Ht 64.0 in | Wt 150.7 lb

## 2019-10-14 DIAGNOSIS — Z124 Encounter for screening for malignant neoplasm of cervix: Secondary | ICD-10-CM | POA: Insufficient documentation

## 2019-10-14 DIAGNOSIS — Z01419 Encounter for gynecological examination (general) (routine) without abnormal findings: Secondary | ICD-10-CM | POA: Insufficient documentation

## 2019-10-14 DIAGNOSIS — Z8742 Personal history of other diseases of the female genital tract: Secondary | ICD-10-CM | POA: Diagnosis not present

## 2019-10-14 DIAGNOSIS — N926 Irregular menstruation, unspecified: Secondary | ICD-10-CM

## 2019-10-14 NOTE — Progress Notes (Signed)
GYNECOLOGY ANNUAL PHYSICAL EXAM PROGRESS NOTE  Subjective:    Mikayla Brown is a 34 y.o. G38P1001 female who presents for an annual exam. The patient has no complaints today. The patient is not currently sexually active. The patient wears seatbelts: yes. The patient participates in regular exercise: no. Has the patient ever been transfused or tattooed?: no. The patient reports that there is not domestic violence in her life.    Gynecologic History  Menarche age: 82 Patient's last menstrual period was 09/08/2019.   Cycles usually 30-40 days, lasts 4-5 days, moderate flow. Patient also still occasionally nursing.  Contraception: abstinence currently.  History of STI's: Denies.  Last Pap: 09/18/2016. Results were: normal.  Reports remote h/o abnormal pap smears (h/o LEEP, 5-6 years ago).    OB History  Gravida Para Term Preterm AB Living  1 1 1  0 0 1  SAB TAB Ectopic Multiple Live Births  0 0 0 0 1    # Outcome Date GA Lbr Len/2nd Weight Sex Delivery Anes PTL Lv  1 Term 06/12/17 [redacted]w[redacted]d / 02:13 8 lb 11.7 oz (3.96 kg) F Vag-Spont EPI  LIV     Name: Plotts,GIRL Maira     Apgar1: 7  Apgar5: 9    Past Medical History:  Diagnosis Date  . Acne    followed by dermatology  . Acne   . Anxiety   . Esophagitis   . GERD (gastroesophageal reflux disease)   . Heavy menses   . HPV test positive   . Painful menstruation   . Rosacea   . Stress   . Weight loss     Past Surgical History:  Procedure Laterality Date  . EYE SURGERY    . LEEP      Family History  Problem Relation Age of Onset  . Thyroid disease Mother   . Breast cancer Maternal Grandmother   . Stomach cancer Maternal Grandmother   . Stomach cancer Other   . Colon cancer Neg Hx   . Diabetes Neg Hx   . Heart disease Neg Hx   . Ovarian cancer Neg Hx     Social History   Socioeconomic History  . Marital status: Single    Spouse name: Not on file  . Number of children: 0  . Years of education: Not on file   . Highest education level: Not on file  Occupational History    Employer: [employer]  Tobacco Use  . Smoking status: Never Smoker  . Smokeless tobacco: Never Used  Vaping Use  . Vaping Use: Never used  Substance and Sexual Activity  . Alcohol use: Yes    Alcohol/week: 0.0 standard drinks    Comment: occasional  . Drug use: No    Types: Marijuana    Comment: occas/nearly stopped  . Sexual activity: Not Currently    Birth control/protection: None  Other Topics Concern  . Not on file  Social History Narrative  . Not on file   Social Determinants of Health   Financial Resource Strain:   . Difficulty of Paying Living Expenses:   Food Insecurity:   . Worried About [redacted]w[redacted]d in the Last Year:   . Programme researcher, broadcasting/film/video in the Last Year:   Transportation Needs:   . Barista (Medical):   Freight forwarder Lack of Transportation (Non-Medical):   Physical Activity:   . Days of Exercise per Week:   . Minutes of Exercise per Session:   Stress:   .  Feeling of Stress :   Social Connections:   . Frequency of Communication with Friends and Family:   . Frequency of Social Gatherings with Friends and Family:   . Attends Religious Services:   . Active Member of Clubs or Organizations:   . Attends Banker Meetings:   Marland Kitchen Marital Status:   Intimate Partner Violence:   . Fear of Current or Ex-Partner:   . Emotionally Abused:   Marland Kitchen Physically Abused:   . Sexually Abused:     No current outpatient medications on file prior to visit.   No current facility-administered medications on file prior to visit.    Allergies  Allergen Reactions  . Penicillins   . Sulfa Antibiotics      Review of Systems Constitutional: negative for chills, fatigue, fevers and sweats Eyes: negative for irritation, redness and visual disturbance Ears, nose, mouth, throat, and face: negative for hearing loss, nasal congestion, snoring and tinnitus Respiratory: negative for asthma, cough,  sputum Cardiovascular: negative for chest pain, dyspnea, exertional chest pressure/discomfort, irregular heart beat, palpitations and syncope Gastrointestinal: negative for abdominal pain, change in bowel habits, nausea and vomiting Genitourinary: positive for irregular menstrual periods, negative genital lesions, sexual problems and vaginal discharge, dysuria and urinary incontinence Integument/breast: negative for breast lump, breast tenderness and nipple discharge Hematologic/lymphatic: negative for bleeding and easy bruising Musculoskeletal:negative for back pain and muscle weakness Neurological: negative for dizziness, headaches, vertigo and weakness Endocrine: negative for diabetic symptoms including polydipsia, polyuria and skin dryness Allergic/Immunologic: negative for hay fever and urticaria       Objective:  Blood pressure 94/65, pulse 76, height 5\' 4"  (1.626 m), weight 150 lb 11.2 oz (68.4 kg), last menstrual period 09/08/2019, currently breastfeeding. Body mass index is 25.87 kg/m.   General Appearance:    Alert, cooperative, no distress, appears stated age  Head:    Normocephalic, without obvious abnormality, atraumatic  Eyes:    PERRL, conjunctiva/corneas clear, EOM's intact, both eyes  Ears:    Normal external ear canals, both ears  Nose:   Nares normal, septum midline, mucosa normal, no drainage or sinus tenderness  Throat:   Lips, mucosa, and tongue normal; teeth and gums normal  Neck:   Supple, symmetrical, trachea midline, no adenopathy; thyroid: no enlargement/tenderness/nodules; no carotid bruit or JVD  Back:     Symmetric, no curvature, ROM normal, no CVA tenderness  Lungs:     Clear to auscultation bilaterally, respirations unlabored  Chest Wall:    No tenderness or deformity   Heart:    Regular rate and rhythm, S1 and S2 normal, no murmur, rub or gallop  Breast Exam:    No tenderness, masses, or nipple abnormality  Abdomen:     Soft, non-tender, bowel sounds  active all four quadrants, no masses, no organomegaly.    Genitalia:    Pelvic:external genitalia normal, vagina without lesions, discharge, or tenderness, rectovaginal septum  normal. Cervix normal in appearance, no cervical motion tenderness, no adnexal masses or tenderness.  Uterus normal size, shape, mobile, regular contours, nontender.  Rectal:    Normal external sphincter.  No hemorrhoids appreciated. Internal exam not done.   Extremities:   Extremities normal, atraumatic, no cyanosis or edema  Pulses:   2+ and symmetric all extremities  Skin:   Skin color, texture, turgor normal, no rashes or lesions  Lymph nodes:   Cervical, supraclavicular, and axillary nodes normal  Neurologic:   CNII-XII intact, normal strength, sensation and reflexes throughout   .  Labs:  Lab Results  Component Value Date   WBC 6.9 11/15/2018   HGB 14.0 11/15/2018   HCT 42.7 11/15/2018   MCV 88 11/15/2018   PLT 236 11/15/2018    Lab Results  Component Value Date   CREATININE 0.70 11/15/2018   BUN 9 11/15/2018   NA 141 11/15/2018   K 4.1 11/15/2018   CL 103 11/15/2018   CO2 25 11/15/2018    Lab Results  Component Value Date   ALT 10 11/15/2018   AST 12 11/15/2018   ALKPHOS 74 11/15/2018   BILITOT 0.9 11/15/2018    Lab Results  Component Value Date   TSH 1.000 11/15/2018    Assessment:    Healthy female exam.  Lactating mother Irregular menses History of abnormal pap smear  Plan:    - Blood tests: None ordered. Has appt with PCP today as well for annual physical. - Breast self exam technique reviewed and patient encouraged to perform self-exam monthly. - Contraception: abstinence. - Discussed healthy lifestyle modifications. - Pap smear performed today. H/o abnormal pap smears with LEEP in the past.  - Has completed COVID vaccination series.  - Follow up in 1 year.     [facility] - 10/14/19 [id number]      Pregnancy Intention Screening   Does the patient want to become pregnant in  the next year? No    Does the patient's partner want to become pregnant in the next year? N/A    Would the patient like to discuss contraceptive options today? No      Contraception Wrap Up   Contraception Counseling Provided No          The pregnancy intention screening data noted above was reviewed. Potential methods of contraception were discussed. The patient elected to proceed with Abstinence.     Hildred Laser, MD [facility]

## 2019-10-14 NOTE — Progress Notes (Signed)
Pt present annual exam. Pt stated that she was doing well no problems. Pt stated having COVIDs [employer]) on 05/31/2019 and 06/21/2019.

## 2019-10-14 NOTE — Addendum Note (Signed)
Addended by: Silvano Bilis on: 10/14/2019 09:56 AM   Modules accepted: Orders

## 2019-10-14 NOTE — Patient Instructions (Addendum)
Preventive Care 21-34 Years Old, Female Preventive care refers to visits with your health care provider and lifestyle choices that can promote health and wellness. This includes:  A yearly physical exam. This may also be called an annual well check.  Regular dental visits and eye exams.  Immunizations.  Screening for certain conditions.  Healthy lifestyle choices, such as eating a healthy diet, getting regular exercise, not using drugs or products that contain nicotine and tobacco, and limiting alcohol use. What can I expect for my preventive care visit? Physical exam Your health care provider will check your:  Height and weight. This may be used to calculate body mass index (BMI), which tells if you are at a healthy weight.  Heart rate and blood pressure.  Skin for abnormal spots. Counseling Your health care provider may ask you questions about your:  Alcohol, tobacco, and drug use.  Emotional well-being.  Home and relationship well-being.  Sexual activity.  Eating habits.  Work and work environment.  Method of birth control.  Menstrual cycle.  Pregnancy history. What immunizations do I need?  Influenza (flu) vaccine  This is recommended every year. Tetanus, diphtheria, and pertussis (Tdap) vaccine  You may need a Td booster every 10 years. Varicella (chickenpox) vaccine  You may need this if you have not been vaccinated. Human papillomavirus (HPV) vaccine  If recommended by your health care provider, you may need three doses over 6 months. Measles, mumps, and rubella (MMR) vaccine  You may need at least one dose of MMR. You may also need a second dose. Meningococcal conjugate (MenACWY) vaccine  One dose is recommended if you are age 19-21 years and a first-year college student living in a residence hall, or if you have one of several medical conditions. You may also need additional booster doses. Pneumococcal conjugate (PCV13) vaccine  You may need  this if you have certain conditions and were not previously vaccinated. Pneumococcal polysaccharide (PPSV23) vaccine  You may need one or two doses if you smoke cigarettes or if you have certain conditions. Hepatitis A vaccine  You may need this if you have certain conditions or if you travel or work in places where you may be exposed to hepatitis A. Hepatitis B vaccine  You may need this if you have certain conditions or if you travel or work in places where you may be exposed to hepatitis B. Haemophilus influenzae type b (Hib) vaccine  You may need this if you have certain conditions. You may receive vaccines as individual doses or as more than one vaccine together in one shot (combination vaccines). Talk with your health care provider about the risks and benefits of combination vaccines. What tests do I need?  Blood tests  Lipid and cholesterol levels. These may be checked every 5 years starting at age 20.  Hepatitis C test.  Hepatitis B test. Screening  Diabetes screening. This is done by checking your blood sugar (glucose) after you have not eaten for a while (fasting).  Sexually transmitted disease (STD) testing.  BRCA-related cancer screening. This may be done if you have a family history of breast, ovarian, tubal, or peritoneal cancers.  Pelvic exam and Pap test. This may be done every 3 years starting at age 21. Starting at age 30, this may be done every 5 years if you have a Pap test in combination with an HPV test. Talk with your health care provider about your test results, treatment options, and if necessary, the need for more tests.   Follow these instructions at home: Eating and drinking   Eat a diet that includes fresh fruits and vegetables, whole grains, lean protein, and low-fat dairy.  Take vitamin and mineral supplements as recommended by your health care provider.  Do not drink alcohol if: ? Your health care provider tells you not to drink. ? You are  pregnant, may be pregnant, or are planning to become pregnant.  If you drink alcohol: ? Limit how much you have to 0-1 drink a day. ? Be aware of how much alcohol is in your drink. In the [place] one drink equals one 12 oz bottle of beer (355 mL), one 5 oz glass of wine (148 mL), or one 1 oz glass of hard liquor (44 mL). Lifestyle  Take daily care of your teeth and gums.  Stay active. Exercise for at least 30 minutes on 5 or more days each week.  Do not use any products that contain nicotine or tobacco, such as cigarettes, e-cigarettes, and chewing tobacco. If you need help quitting, ask your health care provider.  If you are sexually active, practice safe sex. Use a condom or other form of birth control (contraception) in order to prevent pregnancy and STIs (sexually transmitted infections). If you plan to become pregnant, see your health care provider for a preconception visit. What's next?  Visit your health care provider once a year for a well check visit.  Ask your health care provider how often you should have your eyes and teeth checked.  Stay up to date on all vaccines. This information is not intended to replace advice given to you by your health care provider. Make sure you discuss any questions you have with your health care provider. Document Revised: 12/10/2017 Document Reviewed: 12/10/2017 Elsevier Patient Education  2020 [employer] Breast Self-Awareness Breast self-awareness is knowing how your breasts look and feel. Doing breast self-awareness is important. It allows you to catch a breast problem early while it is still small and can be treated. All women should do breast self-awareness, including women who have had breast implants. Tell your doctor if you notice a change in your breasts. What you need:  A mirror.  A well-lit room. How to do a breast self-exam A breast self-exam is one way to learn what is normal for your breasts and to check for changes. To do a  breast self-exam: Look for changes  1. Take off all the clothes above your waist. 2. Stand in front of a mirror in a room with good lighting. 3. Put your hands on your hips. 4. Push your hands down. 5. Look at your breasts and nipples in the mirror to see if one breast or nipple looks different from the other. Check to see if: ? The shape of one breast is different. ? The size of one breast is different. ? There are wrinkles, dips, and bumps in one breast and not the other. 6. Look at each breast for changes in the skin, such as: ? Redness. ? Scaly areas. 7. Look for changes in your nipples, such as: ? Liquid around the nipples. ? Bleeding. ? Dimpling. ? Redness. ? A change in where the nipples are. Feel for changes  1. Lie on your back on the floor. 2. Feel each breast. To do this, follow these steps: ? Pick a breast to feel. ? Put the arm closest to that breast above your head. ? Use your other arm to feel the nipple area of your breast. Feel   breast. Feel the area with the pads of your three middle fingers by making small circles with your fingers. For the first circle, press lightly. For the second circle, press harder. For the third circle, press even harder. ? Keep making circles with your fingers at the different pressures as you move down your breast. Stop when you feel your ribs. ? Move your fingers a little toward the center of your body. ? Start making circles with your fingers again, this time going up until you reach your collarbone. ? Keep making up-and-down circles until you reach your armpit. Remember to keep using the three pressures. ? Feel the other breast in the same way. 3. Sit or stand in the tub or shower. 4. With soapy water on your skin, feel each breast the same way you did in step 2 when you were lying on the floor. Write down what you find Writing down what you find can help you remember what to tell your doctor. Write down:  What is normal for each breast.  Any  changes you find in each breast, including: ? The kind of changes you find. ? Whether you have pain. ? Size and location of any lumps.  When you last had your menstrual period. General tips  Check your breasts every month.  If you are breastfeeding, the best time to check your breasts is after you feed your baby or after you use a breast pump.  If you get menstrual periods, the best time to check your breasts is 5-7 days after your menstrual period is over.  With time, you will become comfortable with the self-exam, and you will begin to know if there are changes in your breasts. Contact a doctor if you:  See a change in the shape or size of your breasts or nipples.  See a change in the skin of your breast or nipples, such as red or scaly skin.  Have fluid coming from your nipples that is not normal.  Find a lump or thick area that was not there before.  Have pain in your breasts.  Have any concerns about your breast health. Summary  Breast self-awareness includes looking for changes in your breasts, as well as feeling for changes within your breasts.  Breast self-awareness should be done in front of a mirror in a well-lit room.  You should check your breasts every month. If you get menstrual periods, the best time to check your breasts is 5-7 days after your menstrual period is over.  Let your doctor know of any changes you see in your breasts, including changes in size, changes on the skin, pain or tenderness, or fluid from your nipples that is not normal. This information is not intended to replace advice given to you by your health care provider. Make sure you discuss any questions you have with your health care provider. Document Revised: 11/17/2017 Document Reviewed: 11/17/2017 Elsevier Patient Education  [place].

## 2019-10-19 LAB — CYTOLOGY - PAP
Comment: NEGATIVE
Diagnosis: NEGATIVE
High risk HPV: NEGATIVE

## 2019-10-23 ENCOUNTER — Encounter: Payer: Self-pay | Admitting: Internal Medicine

## 2019-10-23 NOTE — Assessment & Plan Note (Signed)
Increased stress as outlined.  Discussed with her today.  Has good support.  Overall handling things relatively well.  Follow.

## 2019-10-23 NOTE — Assessment & Plan Note (Signed)
Physical today - 10/13/19.  Followed by gyn for pelvic exams.

## 2019-10-23 NOTE — Assessment & Plan Note (Signed)
Followed by GYN.  Has appt tomorrow.

## 2020-10-10 ENCOUNTER — Encounter: Payer: Self-pay | Admitting: Internal Medicine

## 2020-10-16 ENCOUNTER — Encounter: Payer: Managed Care, Other (non HMO) | Admitting: Internal Medicine

## 2020-10-19 ENCOUNTER — Other Ambulatory Visit: Payer: Self-pay

## 2020-10-19 ENCOUNTER — Ambulatory Visit (INDEPENDENT_AMBULATORY_CARE_PROVIDER_SITE_OTHER): Payer: Managed Care, Other (non HMO) | Admitting: Obstetrics and Gynecology

## 2020-10-19 ENCOUNTER — Encounter: Payer: Self-pay | Admitting: Obstetrics and Gynecology

## 2020-10-19 VITALS — BP 124/88 | HR 112 | Ht 64.0 in | Wt 137.5 lb

## 2020-10-19 DIAGNOSIS — N9089 Other specified noninflammatory disorders of vulva and perineum: Secondary | ICD-10-CM

## 2020-10-19 DIAGNOSIS — Z01419 Encounter for gynecological examination (general) (routine) without abnormal findings: Secondary | ICD-10-CM | POA: Diagnosis not present

## 2020-10-19 NOTE — Progress Notes (Signed)
GYNECOLOGY ANNUAL PHYSICAL EXAM PROGRESS NOTE  Subjective:    Mikayla Brown is a 35 y.o. G5P1001 female who presents for an annual exam. The patient has no complaints today. The patient is not currently sexually active. The patient wears seatbelts: yes. The patient participates in regular exercise: no. Has the patient ever been transfused or tattooed?: no. The patient reports that there is not domestic violence in her life.    Noted in March she had an episode of feeling like her vagina was swollen, and felt irritation or small vaginal tear a few days prior to her cycle, but did not see anything.  Has only happened one other time since then.  2. Reports that she has actively been working on losing weight, has lost 10-15 lbs in the past few months.  Notes she cut down on alcoholic beverages and begin exercising.   Gynecologic History  Menarche age: 63 Patient's last menstrual period was 09/19/2020.   Cycles usually 26-35 days, lasts 4-5 days, moderate flow. Patient also still occasionally nursing.  Contraception: abstinence.  History of STI's: Denies.  Last Pap: 11/01/2019. Results were: normal.  Reports remote h/o abnormal pap smears (h/o LEEP, 6 years ago).       Upstream - 10/19/20 0920       Pregnancy Intention Screening   Does the patient want to become pregnant in the next year? No    Does the patient's partner want to become pregnant in the next year? N/A    Would the patient like to discuss contraceptive options today? No      Contraception Wrap Up   Current Method Abstinence    End Method Abstinence    Contraception Counseling Provided No            The pregnancy intention screening data noted above was reviewed. Potential methods of contraception were not discussed. The patient elected to proceed with Abstinence.         OB History  Gravida Para Term Preterm AB Living  1 1 1  0 0 1  SAB IAB Ectopic Multiple Live Births  0 0 0 0 1    # Outcome Date  GA Lbr Len/2nd Weight Sex Delivery Anes PTL Lv  1 Term 06/12/17 [redacted]w[redacted]d / 02:13 8 lb 11.7 oz (3.96 kg) F Vag-Spont EPI  LIV     Name: Casciano,GIRL Angelis     Apgar1: 7  Apgar5: 9    Past Medical History:  Diagnosis Date   Acne    followed by dermatology   Acne    Anxiety    Esophagitis    GERD (gastroesophageal reflux disease)    Heavy menses    HPV test positive    Painful menstruation    Rosacea    Stress    Weight loss     Past Surgical History:  Procedure Laterality Date   EYE SURGERY     LEEP      Family History  Problem Relation Age of Onset   Thyroid disease Mother    Breast cancer Maternal Grandmother    Stomach cancer Maternal Grandmother    Stomach cancer Other    Colon cancer Neg Hx    Diabetes Neg Hx    Heart disease Neg Hx    Ovarian cancer Neg Hx     Social History   Socioeconomic History   Marital status: Single    Spouse name: Not on file   Number of children: 0   Years of education:  Not on file   Highest education level: Not on file  Occupational History    Employer: [employer]  Tobacco Use   Smoking status: Never   Smokeless tobacco: Never  Vaping Use   Vaping Use: Never used  Substance and Sexual Activity   Alcohol use: Yes    Alcohol/week: 0.0 standard drinks    Comment: occasional   Drug use: No    Types: Marijuana    Comment: occas/nearly stopped   Sexual activity: Not Currently    Birth control/protection: None  Other Topics Concern   Not on file  Social History Narrative   Not on file   Social Determinants of Health   Financial Resource Strain: Not on file  Food Insecurity: Not on file  Transportation Needs: Not on file  Physical Activity: Not on file  Stress: Not on file  Social Connections: Not on file  Intimate Partner Violence: Not on file    No current outpatient medications on file prior to visit.   No current facility-administered medications on file prior to visit.    Allergies  Allergen Reactions    Penicillins    Sulfa Antibiotics      Review of Systems Constitutional: negative for chills, fatigue, fevers and sweats Eyes: negative for irritation, redness and visual disturbance Ears, nose, mouth, throat, and face: negative for hearing loss, nasal congestion, snoring and tinnitus Respiratory: negative for asthma, cough, sputum Cardiovascular: negative for chest pain, dyspnea, exertional chest pressure/discomfort, irregular heart beat, palpitations and syncope Gastrointestinal: negative for abdominal pain, change in bowel habits, nausea and vomiting Genitourinary:  negative for abnormal menstrual cycles, genital lesions, sexual problems and vaginal discharge, dysuria and urinary incontinence. Positive for vulvar dryness and vulvovaginal swelling.  Integument/breast: negative for breast lump, breast tenderness and nipple discharge Hematologic/lymphatic: negative for bleeding and easy bruising Musculoskeletal:negative for back pain and muscle weakness Neurological: negative for dizziness, headaches, vertigo and weakness Endocrine: negative for diabetic symptoms including polydipsia, polyuria and skin dryness Allergic/Immunologic: negative for hay fever and urticaria       Objective:  Blood pressure 124/88, pulse (!) 112, height 5\' 4"  (1.626 m), weight 137 lb 8 oz (62.4 kg), last menstrual period 09/19/2020, currently breastfeeding. Body mass index is 23.6 kg/m.   General Appearance:    Alert, cooperative, no distress, appears stated age  Head:    Normocephalic, without obvious abnormality, atraumatic  Eyes:    PERRL, conjunctiva/corneas clear, EOM's intact, both eyes  Ears:    Normal external ear canals, both ears  Nose:   Nares normal, septum midline, mucosa normal, no drainage or sinus tenderness  Throat:   Lips, mucosa, and tongue normal; teeth and gums normal  Neck:   Supple, symmetrical, trachea midline, no adenopathy; thyroid: no enlargement/tenderness/nodules; no carotid bruit  or JVD  Back:     Symmetric, no curvature, ROM normal, no CVA tenderness  Lungs:     Clear to auscultation bilaterally, respirations unlabored  Chest Wall:    No tenderness or deformity   Heart:    Regular rate and rhythm, S1 and S2 normal, no murmur, rub or gallop  Breast Exam:    No tenderness, masses, or nipple abnormality  Abdomen:     Soft, non-tender, bowel sounds active all four quadrants, no masses, no organomegaly.    Genitalia:    Pelvic:external genitalia with mild thinning of vulvar skin. Vagina without lesions, discharge, or tenderness, rectovaginal septum  normal. Cervix normal in appearance, no cervical motion tenderness, no adnexal masses  or tenderness.  Uterus normal size, shape, mobile, regular contours, nontender.  Rectal:    Normal external sphincter.  No hemorrhoids appreciated. Internal exam not done.   Extremities:   Extremities normal, atraumatic, no cyanosis or edema  Pulses:   2+ and symmetric all extremities  Skin:   Skin color, texture, turgor normal, no rashes or lesions  Lymph nodes:   Cervical, supraclavicular, and axillary nodes normal  Neurologic:   CNII-XII intact, normal strength, sensation and reflexes throughout   .  Labs:  Lab Results  Component Value Date   WBC 6.9 11/15/2018   HGB 14.0 11/15/2018   HCT 42.7 11/15/2018   MCV 88 11/15/2018   PLT 236 11/15/2018    Lab Results  Component Value Date   CREATININE 0.70 11/15/2018   BUN 9 11/15/2018   NA 141 11/15/2018   K 4.1 11/15/2018   CL 103 11/15/2018   CO2 25 11/15/2018    Lab Results  Component Value Date   ALT 10 11/15/2018   AST 12 11/15/2018   ALKPHOS 74 11/15/2018   BILITOT 0.9 11/15/2018    Lab Results  Component Value Date   TSH 1.000 11/15/2018    Assessment:    Healthy female exam. Lactating mother Vulvar dryness  Plan:    - Blood tests: None ordered. Has appt with PCP soon for annual physical. - Breast self exam technique reviewed and patient encouraged to  perform self-exam monthly. - Contraception: abstinence. - Discussed healthy lifestyle modifications. - Pap smear up to date.  - Has completed COVID vaccination series.  - Vulvar dryness, discussed use of coconut oil to external area.  If condition worsens, can utilize external estrogen cream.  - Follow up in 1 year.     Hildred Laser, MD [facility]

## 2020-10-19 NOTE — Patient Instructions (Signed)
Breast Self-Awareness Breast self-awareness is knowing how your breasts look and feel. Doing breast self-awareness is important. It allows you to catch a breast problem early while it is still small and can be treated. All women should do breast self-awareness, including women who have had breast implants. Tell your doctorif you notice a change in your breasts. What you need: A mirror. A well-lit room. How to do a breast self-exam A breast self-exam is one way to learn what is normal for your breasts and tocheck for changes. To do a breast self-exam: Look for changes  Take off all the clothes above your waist. Stand in front of a mirror in a room with good lighting. Put your hands on your hips. Push your hands down. Look at your breasts and nipples in the mirror to see if one breast or nipple looks different from the other. Check to see if: The shape of one breast is different. The size of one breast is different. There are wrinkles, dips, and bumps in one breast and not the other. Look at each breast for changes in the skin, such as: Redness. Scaly areas. Look for changes in your nipples, such as: Liquid around the nipples. Bleeding. Dimpling. Redness. A change in where the nipples are.  Feel for changes  Lie on your back on the floor. Feel each breast. To do this, follow these steps: Pick a breast to feel. Put the arm closest to that breast above your head. Use your other arm to feel the nipple area of your breast. Feel the area with the pads of your three middle fingers by making small circles with your fingers. For the first circle, press lightly. For the second circle, press harder. For the third circle, press even harder. Keep making circles with your fingers at the different pressures as you move down your breast. Stop when you feel your ribs. Move your fingers a little toward the center of your body. Start making circles with your fingers again, this time going up until  you reach your collarbone. Keep making up-and-down circles until you reach your armpit. Remember to keep using the three pressures. Feel the other breast in the same way. Sit or stand in the tub or shower. With soapy water on your skin, feel each breast the same way you did in step 2 when you were lying on the floor.  Write down what you find Writing down what you find can help you remember what to tell your doctor. Write down: What is normal for each breast. Any changes you find in each breast, including: The kind of changes you find. Whether you have pain. Size and location of any lumps. When you last had your menstrual period. General tips Check your breasts every month. If you are breastfeeding, the best time to check your breasts is after you feed your baby or after you use a breast pump. If you get menstrual periods, the best time to check your breasts is 5-7 days after your menstrual period is over. With time, you will become comfortable with the self-exam, and you will begin to know if there are changes in your breasts. Contact a doctor if you: See a change in the shape or size of your breasts or nipples. See a change in the skin of your breast or nipples, such as red or scaly skin. Have fluid coming from your nipples that is not normal. Find a lump or thick area that was not there before. Have pain in   your breasts. Have any concerns about your breast health. Summary Breast self-awareness includes looking for changes in your breasts, as well as feeling for changes within your breasts. Breast self-awareness should be done in front of a mirror in a well-lit room. You should check your breasts every month. If you get menstrual periods, the best time to check your breasts is 5-7 days after your menstrual period is over. Let your doctor know of any changes you see in your breasts, including changes in size, changes on the skin, pain or tenderness, or fluid from your nipples that is  not normal. This information is not intended to replace advice given to you by your health care provider. Make sure you discuss any questions you have with your healthcare provider. Document Revised: 11/17/2017 Document Reviewed: 11/17/2017 Elsevier Patient Education  2022 [employer] Inc. Preventive Care 21-39 Years Old, Female Preventive care refers to lifestyle choices and visits with your health care provider that can promote health and wellness. This includes: A yearly physical exam. This is also called an annual wellness visit. Regular dental and eye exams. Immunizations. Screening for certain conditions. Healthy lifestyle choices, such as: Eating a healthy diet. Getting regular exercise. Not using drugs or products that contain nicotine and tobacco. Limiting alcohol use. What can I expect for my preventive care visit? Physical exam Your health care provider may check your: Height and weight. These may be used to calculate your BMI (body mass index). BMI is a measurement that tells if you are at a healthy weight. Heart rate and blood pressure. Body temperature. Skin for abnormal spots. Counseling Your health care provider may ask you questions about your: Past medical problems. Family's medical history. Alcohol, tobacco, and drug use. Emotional well-being. Home life and relationship well-being. Sexual activity. Diet, exercise, and sleep habits. Work and work environment. Access to firearms. Method of birth control. Menstrual cycle. Pregnancy history. What immunizations do I need?  Vaccines are usually given at various ages, according to a schedule. Your health care provider will recommend vaccines for you based on your age, medicalhistory, and lifestyle or other factors, such as travel or where you work. What tests do I need?  Blood tests Lipid and cholesterol levels. These may be checked every 5 years starting at age 20. Hepatitis C test. Hepatitis B  test. Screening Diabetes screening. This is done by checking your blood sugar (glucose) after you have not eaten for a while (fasting). STD (sexually transmitted disease) testing, if you are at risk. BRCA-related cancer screening. This may be done if you have a family history of breast, ovarian, tubal, or peritoneal cancers. Pelvic exam and Pap test. This may be done every 3 years starting at age 21. Starting at age 30, this may be done every 5 years if you have a Pap test in combination with an HPV test. Talk with your health care provider about your test results, treatment options,and if necessary, the need for more tests. Follow these instructions at home: Eating and drinking  Eat a healthy diet that includes fresh fruits and vegetables, whole grains, lean protein, and low-fat dairy products. Take vitamin and mineral supplements as recommended by your health care provider. Do not drink alcohol if: Your health care provider tells you not to drink. You are pregnant, may be pregnant, or are planning to become pregnant. If you drink alcohol: Limit how much you have to 0-1 drink a day. Be aware of how much alcohol is in your drink. In the [place] one   drink equals one 12 oz bottle of beer (355 mL), one 5 oz glass of wine (148 mL), or one 1 oz glass of hard liquor (44 mL).  Lifestyle Take daily care of your teeth and gums. Brush your teeth every morning and night with fluoride toothpaste. Floss one time each day. Stay active. Exercise for at least 30 minutes 5 or more days each week. Do not use any products that contain nicotine or tobacco, such as cigarettes, e-cigarettes, and chewing tobacco. If you need help quitting, ask your health care provider. Do not use drugs. If you are sexually active, practice safe sex. Use a condom or other form of protection to prevent STIs (sexually transmitted infections). If you do not wish to become pregnant, use a form of birth control. If you plan to become  pregnant, see your health care provider for a prepregnancy visit. Find healthy ways to cope with stress, such as: Meditation, yoga, or listening to music. Journaling. Talking to a trusted person. Spending time with friends and family. Safety Always wear your seat belt while driving or riding in a vehicle. Do not drive: If you have been drinking alcohol. Do not ride with someone who has been drinking. When you are tired or distracted. While texting. Wear a helmet and other protective equipment during sports activities. If you have firearms in your house, make sure you follow all gun safety procedures. Seek help if you have been physically or sexually abused. What's next? Go to your health care provider once a year for an annual wellness visit. Ask your health care provider how often you should have your eyes and teeth checked. Stay up to date on all vaccines. This information is not intended to replace advice given to you by your health care provider. Make sure you discuss any questions you have with your healthcare provider. Document Revised: 11/27/2019 Document Reviewed: 12/10/2017 Elsevier Patient Education  2022 Elsevier Inc.  

## 2020-10-19 NOTE — Progress Notes (Signed)
Pt present for annual exam. Pt stated that she was doing well. No problems.

## 2020-11-23 ENCOUNTER — Encounter: Payer: Managed Care, Other (non HMO) | Admitting: Internal Medicine

## 2020-12-24 ENCOUNTER — Encounter: Payer: Managed Care, Other (non HMO) | Admitting: Internal Medicine

## 2021-01-21 ENCOUNTER — Encounter: Payer: Managed Care, Other (non HMO) | Admitting: Internal Medicine

## 2021-04-01 ENCOUNTER — Ambulatory Visit (INDEPENDENT_AMBULATORY_CARE_PROVIDER_SITE_OTHER): Payer: Managed Care, Other (non HMO) | Admitting: Internal Medicine

## 2021-04-01 ENCOUNTER — Other Ambulatory Visit: Payer: Self-pay

## 2021-04-01 VITALS — BP 118/72 | HR 89 | Temp 97.9°F | Resp 16 | Ht 64.0 in | Wt 141.2 lb

## 2021-04-01 DIAGNOSIS — F439 Reaction to severe stress, unspecified: Secondary | ICD-10-CM

## 2021-04-01 DIAGNOSIS — Z0000 Encounter for general adult medical examination without abnormal findings: Secondary | ICD-10-CM | POA: Diagnosis not present

## 2021-04-01 NOTE — Progress Notes (Signed)
Patient ID: Mikayla Brown, female   DOB: 09/12/1985, 35 y.o.   MRN: [id number]   Subjective:    Patient ID: Mikayla Brown, female    DOB: 08-23-1985, 35 y.o.   MRN: [id number]  This visit occurred during the SARS-CoV-2 public health emergency.  Safety protocols were in place, including screening questions prior to the visit, additional usage of staff PPE, and extensive cleaning of exam room while observing appropriate contact time as indicated for disinfecting solutions.   Patient here for her physical exam.   Chief Complaint  Patient presents with   Annual Exam   .   HPI She sees gyn for pelvic and pap smears.  Has stopped nursing.  Working.  Appears to be handling stress.  Trying to stay active.  No chest pain or sob reported.  No abdominal pain or bowel change reported.  Is avoiding milk.  Doing better since avoiding.  Had covid late July.  Discussed residual taste issues post covid.  No increased cough or congestion.     Past Medical History:  Diagnosis Date   Acne    followed by dermatology   Acne    Anxiety    Esophagitis    GERD (gastroesophageal reflux disease)    Heavy menses    HPV test positive    Painful menstruation    Rosacea    Stress    Weight loss    Past Surgical History:  Procedure Laterality Date   EYE SURGERY     LEEP     Family History  Problem Relation Age of Onset   Thyroid disease Mother    Breast cancer Maternal Grandmother    Stomach cancer Maternal Grandmother    Stomach cancer Other    Colon cancer Neg Hx    Diabetes Neg Hx    Heart disease Neg Hx    Ovarian cancer Neg Hx    Social History   Socioeconomic History   Marital status: Single    Spouse name: Not on file   Number of children: 0   Years of education: Not on file   Highest education level: Not on file  Occupational History    Employer: [employer]  Tobacco Use   Smoking status: Never   Smokeless tobacco: Never  Vaping Use   Vaping Use: Never used  Substance and  Sexual Activity   Alcohol use: Yes    Alcohol/week: 0.0 standard drinks    Comment: occasional   Drug use: No    Types: Marijuana    Comment: occas/nearly stopped   Sexual activity: Not Currently    Birth control/protection: None  Other Topics Concern   Not on file  Social History Narrative   Not on file   Social Determinants of Health   Financial Resource Strain: Not on file  Food Insecurity: Not on file  Transportation Needs: Not on file  Physical Activity: Not on file  Stress: Not on file  Social Connections: Not on file     Review of Systems  Constitutional:  Negative for appetite change and unexpected weight change.  HENT:  Negative for congestion, sinus pressure and sore throat.   Eyes:  Negative for pain and visual disturbance.  Respiratory:  Negative for cough, chest tightness and shortness of breath.   Cardiovascular:  Negative for chest pain, palpitations and leg swelling.  Gastrointestinal:  Negative for abdominal pain, diarrhea, nausea and vomiting.  Genitourinary:  Negative for difficulty urinating and dysuria.  Musculoskeletal:  Negative for joint swelling  and myalgias.  Skin:  Negative for color change and rash.  Neurological:  Negative for dizziness, light-headedness and headaches.  Hematological:  Negative for adenopathy. Does not bruise/bleed easily.  Psychiatric/Behavioral:  Negative for agitation and dysphoric mood.       Objective:     BP 118/72    Pulse 89    Temp 97.9 F (36.6 C)    Resp 16    Ht 5\' 4"  (1.626 m)    Wt 141 lb 3.2 oz (64 kg)    SpO2 99%    BMI 24.24 kg/m  Wt Readings from Last 3 Encounters:  04/01/21 141 lb 3.2 oz (64 kg)  10/19/20 137 lb 8 oz (62.4 kg)  10/14/19 150 lb 11.2 oz (68.4 kg)    Physical Exam Vitals reviewed.  Constitutional:      General: She is not in acute distress.    Appearance: Normal appearance.  HENT:     Head: Normocephalic and atraumatic.     Right Ear: External ear normal.     Left Ear: External  ear normal.  Eyes:     General: No scleral icterus.       Right eye: No discharge.        Left eye: No discharge.     Conjunctiva/sclera: Conjunctivae normal.  Neck:     Thyroid: No thyromegaly.  Cardiovascular:     Rate and Rhythm: Normal rate and regular rhythm.  Pulmonary:     Effort: No respiratory distress.     Breath sounds: Normal breath sounds. No wheezing.  Abdominal:     General: Bowel sounds are normal.     Palpations: Abdomen is soft.     Tenderness: There is no abdominal tenderness.  Musculoskeletal:        General: No swelling or tenderness.     Cervical back: Neck supple. No tenderness.  Lymphadenopathy:     Cervical: No cervical adenopathy.  Skin:    Findings: No erythema or rash.  Neurological:     Mental Status: She is alert.  Psychiatric:        Mood and Affect: Mood normal.        Behavior: Behavior normal.     No outpatient encounter medications on file as of 04/01/2021.   No facility-administered encounter medications on file as of 04/01/2021.     Lab Results  Component Value Date   WBC 6.9 11/15/2018   HGB 14.0 11/15/2018   HCT 42.7 11/15/2018   PLT 236 11/15/2018   GLUCOSE 84 11/15/2018   CHOL 159 11/15/2018   TRIG 47 11/15/2018   HDL 65 11/15/2018   LDLCALC 85 11/15/2018   ALT 10 11/15/2018   AST 12 11/15/2018   NA 141 11/15/2018   K 4.1 11/15/2018   CL 103 11/15/2018   CREATININE 0.70 11/15/2018   BUN 9 11/15/2018   CO2 25 11/15/2018   TSH 1.000 11/15/2018       Assessment & Plan:   Problem List Items Addressed This Visit     Health care maintenance    Physical today 04/01/21.  PAP (Dr 04/03/21) - 10/14/19 - negative with negative HPV.  Discussed baseline mammogram. s       Stress    Overall appears to be handling things well.  Follow.        Other Visit Diagnoses     Routine general medical examination at a health care facility    -  Primary  Einar Pheasant, MD

## 2021-04-11 ENCOUNTER — Encounter: Payer: Self-pay | Admitting: Internal Medicine

## 2021-04-11 NOTE — Assessment & Plan Note (Addendum)
Physical today 04/01/21.  PAP (Dr Valentino Saxon) - 10/14/19 - negative with negative HPV.  Discussed baseline mammogram. s

## 2021-04-11 NOTE — Assessment & Plan Note (Signed)
Overall appears to be handling things well.  Follow.  ?

## 2021-05-24 ENCOUNTER — Encounter: Payer: Self-pay | Admitting: Internal Medicine

## 2021-05-24 DIAGNOSIS — Z13220 Encounter for screening for lipoid disorders: Secondary | ICD-10-CM

## 2021-07-03 LAB — COMPREHENSIVE METABOLIC PANEL
ALT: 7 IU/L (ref 0–32)
AST: 11 IU/L (ref 0–40)
Albumin/Globulin Ratio: 1.9 (ref 1.2–2.2)
Albumin: 4.5 g/dL (ref 3.8–4.8)
Alkaline Phosphatase: 60 IU/L (ref 44–121)
BUN/Creatinine Ratio: 14 (ref 9–23)
BUN: 10 mg/dL (ref 6–20)
Bilirubin Total: 0.9 mg/dL (ref 0.0–1.2)
CO2: 22 mmol/L (ref 20–29)
Calcium: 9.2 mg/dL (ref 8.7–10.2)
Chloride: 102 mmol/L (ref 96–106)
Creatinine, Ser: 0.71 mg/dL (ref 0.57–1.00)
Globulin, Total: 2.4 g/dL (ref 1.5–4.5)
Glucose: 90 mg/dL (ref 70–99)
Potassium: 4.7 mmol/L (ref 3.5–5.2)
Sodium: 139 mmol/L (ref 134–144)
Total Protein: 6.9 g/dL (ref 6.0–8.5)
eGFR: 113 mL/min/{1.73_m2} (ref >59)

## 2021-07-03 LAB — CBC WITH DIFFERENTIAL/PLATELET
Basophils Absolute: 0 10*3/uL (ref 0.0–0.2)
Basos: 1 %
EOS (ABSOLUTE): 0.1 10*3/uL (ref 0.0–0.4)
Eos: 2 %
Hematocrit: 40.7 % (ref 34.0–46.6)
Hemoglobin: 13.8 g/dL (ref 11.1–15.9)
Immature Grans (Abs): 0 10*3/uL (ref 0.0–0.1)
Immature Granulocytes: 0 %
Lymphocytes Absolute: 1.7 10*3/uL (ref 0.7–3.1)
Lymphs: 26 %
MCH: 29.9 pg (ref 26.6–33.0)
MCHC: 33.9 g/dL (ref 31.5–35.7)
MCV: 88 fL (ref 79–97)
Monocytes Absolute: 0.5 10*3/uL (ref 0.1–0.9)
Monocytes: 7 %
Neutrophils Absolute: 4.3 10*3/uL (ref 1.4–7.0)
Neutrophils: 64 %
Platelets: 260 10*3/uL (ref 150–450)
RBC: 4.62 x10E6/uL (ref 3.77–5.28)
RDW: 11.7 % (ref 11.7–15.4)
WBC: 6.6 10*3/uL (ref 3.4–10.8)

## 2021-07-03 LAB — LIPID PANEL
Chol/HDL Ratio: 3 ratio (ref 0.0–4.4)
Cholesterol, Total: 154 mg/dL (ref 100–199)
HDL: 51 mg/dL (ref >39)
LDL Chol Calc (NIH): 90 mg/dL (ref 0–99)
Triglycerides: 65 mg/dL (ref 0–149)
VLDL Cholesterol Cal: 13 mg/dL (ref 5–40)

## 2021-07-03 LAB — TSH: TSH: 0.772 u[IU]/mL (ref 0.450–4.500)

## 2021-10-21 NOTE — Patient Instructions (Signed)
Breast Self-Awareness Breast self-awareness is knowing how your breasts look and feel. You need to: Check your breasts on a regular basis. Tell your doctor about any changes. Become familiar with the look and feel of your breasts. This can help you catch a breast problem while it is still small and can be treated. You should do breast self-exams even if you have breast implants. What you need: A mirror. A well-lit room. A pillow or other soft object. How to do a breast self-exam Follow these steps to do a breast self-exam: Look for changes  Take off all the clothes above your waist. Stand in front of a mirror in a room with good lighting. Put your hands down at your sides. Compare your breasts in the mirror. Look for any difference between them, such as: A difference in shape. A difference in size. Wrinkles, dips, and bumps in one breast and not the other. Look at each breast for changes in the skin, such as: Redness. Scaly areas. Skin that has gotten thicker. Dimpling. Open sores (ulcers). Look for changes in your nipples, such as: Fluid coming out of a nipple. Fluid around a nipple. Bleeding. Dimpling. Redness. A nipple that looks pushed in (retracted), or that has changed position. Feel for changes Lie on your back. Feel each breast. To do this: Pick a breast to feel. Place a pillow under the shoulder closest to that breast. Put the arm closest to that breast behind your head. Feel the nipple area of that breast using the hand of your other arm. Feel the area with the pads of your three middle fingers by making small circles with your fingers. Use light, medium, and firm pressure. Continue the overlapping circles, moving downward over the breast. Keep making circles with your fingers. Stop when you feel your ribs. Start making circles with your fingers again, this time going upward until you reach your collarbone. Then, make circles outward across your breast and into your  armpit area. Squeeze your nipple. Check for discharge and lumps. Repeat these steps to check your other breast. Sit or stand in the tub or shower. With soapy water on your skin, feel each breast the same way you did when you were lying down. Write down what you find Writing down what you find can help you remember what to tell your doctor. Write down: What is normal for each breast. Any changes you find in each breast. These include: The kind of changes you find. A tender or painful breast. Any lump you find. Write down its size and where it is. When you last had your monthly period (menstrual cycle). General tips If you are breastfeeding, the best time to check your breasts is after you feed your baby or after you use a breast pump. If you get monthly bleeding, the best time to check your breasts is 5-7 days after your monthly cycle ends. With time, you will become comfortable with the self-exam. You will also start to know if there are changes in your breasts. Contact a doctor if: You see a change in the shape or size of your breasts or nipples. You see a change in the skin of your breast or nipples, such as red or scaly skin. You have fluid coming from your nipples that is not normal. You find a new lump or thick area. You have breast pain. You have any concerns about your breast health. Summary Breast self-awareness includes looking for changes in your breasts and feeling for changes   within your breasts. You should do breast self-awareness in front of a mirror in a well-lit room. If you get monthly periods (menstrual cycles), the best time to check your breasts is 5-7 days after your period ends. Tell your doctor about any changes you see in your breasts. Changes include changes in size, changes on the skin, painful or tender breasts, or fluid from your nipples that is not normal. This information is not intended to replace advice given to you by your health care provider. Make sure  you discuss any questions you have with your health care provider. Document Revised: 01/31/2021 Document Reviewed: 01/31/2021 Elsevier Patient Education  2023 Else[employer] Inc. Preventive Care 21-39 Years Old, Female Preventive care refers to lifestyle choices and visits with your health care provider that can promote health and wellness. Preventive care visits are also called wellness exams. What can I expect for my preventive care visit? Counseling During your preventive care visit, your health care provider may ask about your: Medical history, including: Past medical problems. Family medical history. Pregnancy history. Current health, including: Menstrual cycle. Method of birth control. Emotional well-being. Home life and relationship well-being. Sexual activity and sexual health. Lifestyle, including: Alcohol, nicotine or tobacco, and drug use. Access to firearms. Diet, exercise, and sleep habits. Work and work environment. Sunscreen use. Safety issues such as seatbelt and bike helmet use. Physical exam Your health care provider may check your: Height and weight. These may be used to calculate your BMI (body mass index). BMI is a measurement that tells if you are at a healthy weight. Waist circumference. This measures the distance around your waistline. This measurement also tells if you are at a healthy weight and may help predict your risk of certain diseases, such as type 2 diabetes and high blood pressure. Heart rate and blood pressure. Body temperature. Skin for abnormal spots. What immunizations do I need?  Vaccines are usually given at various ages, according to a schedule. Your health care provider will recommend vaccines for you based on your age, medical history, and lifestyle or other factors, such as travel or where you work. What tests do I need? Screening Your health care provider may recommend screening tests for certain conditions. This may include: Pelvic exam  and Pap test. Lipid and cholesterol levels. Diabetes screening. This is done by checking your blood sugar (glucose) after you have not eaten for a while (fasting). Hepatitis B test. Hepatitis C test. HIV (human immunodeficiency virus) test. STI (sexually transmitted infection) testing, if you are at risk. BRCA-related cancer screening. This may be done if you have a family history of breast, ovarian, tubal, or peritoneal cancers. Talk with your health care provider about your test results, treatment options, and if necessary, the need for more tests. Follow these instructions at home: Eating and drinking  Eat a healthy diet that includes fresh fruits and vegetables, whole grains, lean protein, and low-fat dairy products. Take vitamin and mineral supplements as recommended by your health care provider. Do not drink alcohol if: Your health care provider tells you not to drink. You are pregnant, may be pregnant, or are planning to become pregnant. If you drink alcohol: Limit how much you have to 0-1 drink a day. Know how much alcohol is in your drink. In the [place] one drink equals one 12 oz bottle of beer (355 mL), one 5 oz glass of wine (148 mL), or one 1 oz glass of hard liquor (44 mL). Lifestyle Brush your teeth every morning and   night with fluoride toothpaste. Floss one time each day. Exercise for at least 30 minutes 5 or more days each week. Do not use any products that contain nicotine or tobacco. These products include cigarettes, chewing tobacco, and vaping devices, such as e-cigarettes. If you need help quitting, ask your health care provider. Do not use drugs. If you are sexually active, practice safe sex. Use a condom or other form of protection to prevent STIs. If you do not wish to become pregnant, use a form of birth control. If you plan to become pregnant, see your health care provider for a prepregnancy visit. Find healthy ways to manage stress, such as: Meditation, yoga, or  listening to music. Journaling. Talking to a trusted person. Spending time with friends and family. Minimize exposure to UV radiation to reduce your risk of skin cancer. Safety Always wear your seat belt while driving or riding in a vehicle. Do not drive: If you have been drinking alcohol. Do not ride with someone who has been drinking. If you have been using any mind-altering substances or drugs. While texting. When you are tired or distracted. Wear a helmet and other protective equipment during sports activities. If you have firearms in your house, make sure you follow all gun safety procedures. Seek help if you have been physically or sexually abused. What's next? Go to your health care provider once a year for an annual wellness visit. Ask your health care provider how often you should have your eyes and teeth checked. Stay up to date on all vaccines. This information is not intended to replace advice given to you by your health care provider. Make sure you discuss any questions you have with your health care provider. Document Revised: 09/26/2020 Document Reviewed: 09/26/2020 Elsevier Patient Education  2023 [employer] Inc.  

## 2021-10-21 NOTE — Progress Notes (Unsigned)
GYNECOLOGY ANNUAL PHYSICAL EXAM PROGRESS NOTE  Subjective:    Mikayla Brown is a 36 y.o. G3P1001 female who presents for an annual exam. The patient has no complaints today. The patient is not sexually active. The patient participates in regular exercise: yes. Has the patient ever been transfused or tattooed?: no. The patient reports that there is not domestic violence in her life.    Menstrual History: Menarche age: 29 No LMP recorded.     Gynecologic History:  Contraception: abstinence History of STI's: Denies Last Pap: 11/01/2019. Results were: normal. ***   OB History  Gravida Para Term Preterm AB Living  1 1 1  0 0 1  SAB IAB Ectopic Multiple Live Births  0 0 0 0 1    # Outcome Date GA Lbr Len/2nd Weight Sex Delivery Anes PTL Lv  1 Term 06/12/17 [redacted]w[redacted]d / 02:13 8 lb 11.7 oz (3.96 kg) F Vag-Spont EPI  LIV     Name: Waldron,GIRL Joe     Apgar1: 7  Apgar5: 9    Past Medical History:  Diagnosis Date   Acne    followed by dermatology   Acne    Anxiety    Esophagitis    GERD (gastroesophageal reflux disease)    Heavy menses    HPV test positive    Painful menstruation    Rosacea    Stress    Weight loss     Past Surgical History:  Procedure Laterality Date   EYE SURGERY     LEEP      Family History  Problem Relation Age of Onset   Thyroid disease Mother    Breast cancer Maternal Grandmother    Stomach cancer Maternal Grandmother    Stomach cancer Other    Colon cancer Neg Hx    Diabetes Neg Hx    Heart disease Neg Hx    Ovarian cancer Neg Hx     Social History   Socioeconomic History   Marital status: Single    Spouse name: Not on file   Number of children: 0   Years of education: Not on file   Highest education level: Not on file  Occupational History    Employer: [employer]  Tobacco Use   Smoking status: Never   Smokeless tobacco: Never  Vaping Use   Vaping Use: Never used  Substance and Sexual Activity   Alcohol use: Yes     Alcohol/week: 0.0 standard drinks of alcohol    Comment: occasional   Drug use: No    Types: Marijuana    Comment: occas/nearly stopped   Sexual activity: Not Currently    Birth control/protection: None  Other Topics Concern   Not on file  Social History Narrative   Not on file   Social Determinants of Health   Financial Resource Strain: Not on file  Food Insecurity: Not on file  Transportation Needs: Not on file  Physical Activity: Inactive (10/12/2018)   Exercise Vital Sign    Days of Exercise per Week: 0 days    Minutes of Exercise per Session: 0 min  Stress: Not on file  Social Connections: Not on file  Intimate Partner Violence: Not on file    No current outpatient medications on file prior to visit.   No current facility-administered medications on file prior to visit.    Allergies  Allergen Reactions   Penicillins    Sulfa Antibiotics      Review of Systems Constitutional: negative for chills, fatigue,  fevers and sweats Eyes: negative for irritation, redness and visual disturbance Ears, nose, mouth, throat, and face: negative for hearing loss, nasal congestion, snoring and tinnitus Respiratory: negative for asthma, cough, sputum Cardiovascular: negative for chest pain, dyspnea, exertional chest pressure/discomfort, irregular heart beat, palpitations and syncope Gastrointestinal: negative for abdominal pain, change in bowel habits, nausea and vomiting Genitourinary: negative for abnormal menstrual periods, genital lesions, sexual problems and vaginal discharge, dysuria and urinary incontinence Integument/breast: negative for breast lump, breast tenderness and nipple discharge Hematologic/lymphatic: negative for bleeding and easy bruising Musculoskeletal:negative for back pain and muscle weakness Neurological: negative for dizziness, headaches, vertigo and weakness Endocrine: negative for diabetic symptoms including polydipsia, polyuria and skin  dryness Allergic/Immunologic: negative for hay fever and urticaria      Objective:  currently breastfeeding. There is no height or weight on file to calculate BMI.    General Appearance:    Alert, cooperative, no distress, appears stated age  Head:    Normocephalic, without obvious abnormality, atraumatic  Eyes:    PERRL, conjunctiva/corneas clear, EOM's intact, both eyes  Ears:    Normal external ear canals, both ears  Nose:   Nares normal, septum midline, mucosa normal, no drainage or sinus tenderness  Throat:   Lips, mucosa, and tongue normal; teeth and gums normal  Neck:   Supple, symmetrical, trachea midline, no adenopathy; thyroid: no enlargement/tenderness/nodules; no carotid bruit or JVD  Back:     Symmetric, no curvature, ROM normal, no CVA tenderness  Lungs:     Clear to auscultation bilaterally, respirations unlabored  Chest Wall:    No tenderness or deformity   Heart:    Regular rate and rhythm, S1 and S2 normal, no murmur, rub or gallop  Breast Exam:    No tenderness, masses, or nipple abnormality  Abdomen:     Soft, non-tender, bowel sounds active all four quadrants, no masses, no organomegaly.    Genitalia:    Pelvic:external genitalia normal, vagina without lesions, discharge, or tenderness, rectovaginal septum  normal. Cervix normal in appearance, no cervical motion tenderness, no adnexal masses or tenderness.  Uterus normal size, shape, mobile, regular contours, nontender.  Rectal:    Normal external sphincter.  No hemorrhoids appreciated. Internal exam not done.   Extremities:   Extremities normal, atraumatic, no cyanosis or edema  Pulses:   2+ and symmetric all extremities  Skin:   Skin color, texture, turgor normal, no rashes or lesions  Lymph nodes:   Cervical, supraclavicular, and axillary nodes normal  Neurologic:   CNII-XII intact, normal strength, sensation and reflexes throughout   .  Labs:  Lab Results  Component Value Date   WBC 6.6 07/02/2021   HGB  13.8 07/02/2021   HCT 40.7 07/02/2021   MCV 88 07/02/2021   PLT 260 07/02/2021    Lab Results  Component Value Date   CREATININE 0.71 07/02/2021   BUN 10 07/02/2021   NA 139 07/02/2021   K 4.7 07/02/2021   CL 102 07/02/2021   CO2 22 07/02/2021    Lab Results  Component Value Date   ALT 7 07/02/2021   AST 11 07/02/2021   ALKPHOS 60 07/02/2021   BILITOT 0.9 07/02/2021    Lab Results  Component Value Date   TSH 0.772 07/02/2021     Assessment:   No diagnosis found.   Plan:  Blood tests: Lab UTD. Breast self exam technique reviewed and patient encouraged to perform self-exam monthly. Contraception: abstinence. Discussed healthy lifestyle modifications. Mammogram  :  Not age appropriate Pap smear  UTD . COVID vaccination status: Follow up in 1 year for annual exam   Hildred Laser, MD [facility]

## 2021-10-22 ENCOUNTER — Ambulatory Visit (INDEPENDENT_AMBULATORY_CARE_PROVIDER_SITE_OTHER): Payer: Managed Care, Other (non HMO) | Admitting: Obstetrics and Gynecology

## 2021-10-22 ENCOUNTER — Encounter: Payer: Self-pay | Admitting: Obstetrics and Gynecology

## 2021-10-22 VITALS — BP 103/82 | HR 71 | Resp 16 | Ht 64.0 in | Wt 153.7 lb

## 2021-10-22 DIAGNOSIS — Z01419 Encounter for gynecological examination (general) (routine) without abnormal findings: Secondary | ICD-10-CM

## 2021-10-22 DIAGNOSIS — N920 Excessive and frequent menstruation with regular cycle: Secondary | ICD-10-CM | POA: Diagnosis not present

## 2022-04-03 ENCOUNTER — Ambulatory Visit (INDEPENDENT_AMBULATORY_CARE_PROVIDER_SITE_OTHER): Payer: 59 | Admitting: Internal Medicine

## 2022-04-03 ENCOUNTER — Encounter: Payer: Self-pay | Admitting: Internal Medicine

## 2022-04-03 VITALS — BP 108/70 | HR 72 | Temp 98.0°F | Resp 17 | Ht 63.0 in | Wt 153.1 lb

## 2022-04-03 DIAGNOSIS — R87619 Unspecified abnormal cytological findings in specimens from cervix uteri: Secondary | ICD-10-CM

## 2022-04-03 DIAGNOSIS — Z0000 Encounter for general adult medical examination without abnormal findings: Secondary | ICD-10-CM

## 2022-04-03 DIAGNOSIS — F419 Anxiety disorder, unspecified: Secondary | ICD-10-CM | POA: Diagnosis not present

## 2022-04-03 NOTE — Progress Notes (Signed)
Subjective:    Patient ID: Mikayla Brown, female    DOB: 26-May-1985, 36 y.o.   MRN: [id number]  Patient here for  Chief Complaint  Patient presents with   Annual Exam    HPI Here for her physical. Saw Dr Valentino Saxon 10/22/21.  PAP 10/14/19 - negative with negative HPV. Reports she is doing relatively well.  Increased stress - job change.  Discussed shift change with her job.  Stays active.  No chest pain or sob reported.  No increased cough or congestion.  Skin lesion - seeing dermatology.  Has been on doxycycline.  Has f/u 04/2022.  No vomiting.  No abdominal pain or bowel change reported.     Past Medical History:  Diagnosis Date   Acne    followed by dermatology   Acne    Anxiety    Esophagitis    GERD (gastroesophageal reflux disease)    Heavy menses    HPV test positive    Painful menstruation    Rosacea    Stress    Weight loss    Past Surgical History:  Procedure Laterality Date   EYE SURGERY     LEEP     Family History  Problem Relation Age of Onset   Thyroid disease Mother    Breast cancer Maternal Grandmother    Stomach cancer Maternal Grandmother    Stomach cancer Other    Colon cancer Neg Hx    Diabetes Neg Hx    Heart disease Neg Hx    Ovarian cancer Neg Hx    Social History   Socioeconomic History   Marital status: Single    Spouse name: Not on file   Number of children: 0   Years of education: Not on file   Highest education level: Not on file  Occupational History    Employer: [employer]  Tobacco Use   Smoking status: Never   Smokeless tobacco: Never  Vaping Use   Vaping Use: Never used  Substance and Sexual Activity   Alcohol use: Yes    Alcohol/week: 0.0 standard drinks of alcohol    Comment: occasional   Drug use: No    Types: Marijuana    Comment: occas/nearly stopped   Sexual activity: Not Currently    Birth control/protection: None  Other Topics Concern   Not on file  Social History Narrative   Not on file   Social Determinants  of Health   Financial Resource Strain: Not on file  Food Insecurity: Not on file  Transportation Needs: Not on file  Physical Activity: Inactive (10/12/2018)   Exercise Vital Sign    Days of Exercise per Week: 0 days    Minutes of Exercise per Session: 0 min  Stress: Not on file  Social Connections: Not on file     Review of Systems  Constitutional:  Negative for appetite change and unexpected weight change.  HENT:  Negative for congestion, sinus pressure and sore throat.   Eyes:  Negative for pain and visual disturbance.  Respiratory:  Negative for cough, chest tightness and shortness of breath.   Cardiovascular:  Negative for chest pain, palpitations and leg swelling.  Gastrointestinal:  Negative for abdominal pain, diarrhea, nausea and vomiting.  Genitourinary:  Negative for difficulty urinating and dysuria.  Musculoskeletal:  Negative for joint swelling and myalgias.  Skin:  Negative for color change and rash.  Neurological:  Negative for dizziness and headaches.  Hematological:  Negative for adenopathy. Does not bruise/bleed easily.  Psychiatric/Behavioral:  Negative for agitation and dysphoric mood.        Objective:     BP 108/70   Pulse 72   Temp 98 F (36.7 C) (Oral)   Resp 17   Ht 5\' 3"  (1.6 m)   Wt 153 lb 2 oz (69.5 kg)   LMP 03/18/2022 (Exact Date)   SpO2 98%   BMI 27.12 kg/m  Wt Readings from Last 3 Encounters:  04/03/22 153 lb 2 oz (69.5 kg)  10/22/21 153 lb 11.2 oz (69.7 kg)  04/01/21 141 lb 3.2 oz (64 kg)    Physical Exam Vitals reviewed.  Constitutional:      General: She is not in acute distress.    Appearance: Normal appearance. She is well-developed.  HENT:     Head: Normocephalic and atraumatic.     Right Ear: External ear normal.     Left Ear: External ear normal.  Eyes:     General: No scleral icterus.       Right eye: No discharge.        Left eye: No discharge.     Conjunctiva/sclera: Conjunctivae normal.  Neck:     Thyroid: No  thyromegaly.  Cardiovascular:     Rate and Rhythm: Normal rate and regular rhythm.  Pulmonary:     Effort: No tachypnea, accessory muscle usage or respiratory distress.     Breath sounds: Normal breath sounds. No decreased breath sounds or wheezing.  Chest:  Breasts:    Right: No inverted nipple, mass, nipple discharge or tenderness (no axillary adenopathy).     Left: No inverted nipple, mass, nipple discharge or tenderness (no axilarry adenopathy).  Abdominal:     General: Bowel sounds are normal.     Palpations: Abdomen is soft.     Tenderness: There is no abdominal tenderness.  Musculoskeletal:        General: No swelling or tenderness.     Cervical back: Neck supple.  Lymphadenopathy:     Cervical: No cervical adenopathy.  Skin:    Findings: No erythema or rash.  Neurological:     Mental Status: She is alert and oriented to person, place, and time.  Psychiatric:        Mood and Affect: Mood normal.        Behavior: Behavior normal.      Outpatient Encounter Medications as of 04/03/2022  Medication Sig   doxycycline (VIBRAMYCIN) 100 MG capsule Take 100 mg by mouth 2 (two) times daily.   No facility-administered encounter medications on file as of 04/03/2022.     Lab Results  Component Value Date   WBC 6.6 07/02/2021   HGB 13.8 07/02/2021   HCT 40.7 07/02/2021   PLT 260 07/02/2021   GLUCOSE 90 07/02/2021   CHOL 154 07/02/2021   TRIG 65 07/02/2021   HDL 51 07/02/2021   LDLCALC 90 07/02/2021   ALT 7 07/02/2021   AST 11 07/02/2021   NA 139 07/02/2021   K 4.7 07/02/2021   CL 102 07/02/2021   CREATININE 0.71 07/02/2021   BUN 10 07/02/2021   CO2 22 07/02/2021   TSH 0.772 07/02/2021    No results found.     Assessment & Plan:  Routine general medical examination at a health care facility  Health care maintenance Assessment & Plan: Physical today 04/03/22.  PAP 10/14/19 - negative with negative HPV.  Dr 12/15/19 - recommended mammogram age 41.     Anxiety Assessment & Plan: Increased stress - discussed.  Has  good support.  Follow.    Orders: -     CBC with Differential/Platelet -     Comprehensive metabolic panel -     Lipid panel -     TSH  Abnormal cervical Papanicolaou smear, unspecified abnormal pap finding Assessment & Plan: Followed by gyn.       Dale Barry, MD

## 2022-04-03 NOTE — Assessment & Plan Note (Signed)
Physical today 04/03/22.  PAP 10/14/19 - negative with negative HPV.  Dr Valentino Saxon - recommended mammogram age 36.

## 2022-04-13 ENCOUNTER — Encounter: Payer: Self-pay | Admitting: Internal Medicine

## 2022-04-13 NOTE — Assessment & Plan Note (Signed)
Followed by gyn

## 2022-04-13 NOTE — Assessment & Plan Note (Signed)
Increased stress - discussed.  Has good support.  Follow.

## 2022-04-13 NOTE — Addendum Note (Signed)
Addended by: Charm Barges on: 04/13/2022 07:46 PM   Modules accepted: Level of Service

## 2022-06-30 ENCOUNTER — Telehealth: Payer: Self-pay | Admitting: Internal Medicine

## 2022-06-30 ENCOUNTER — Encounter: Payer: Self-pay | Admitting: Internal Medicine

## 2022-06-30 NOTE — Telephone Encounter (Signed)
Called patient needing to reschedule appointment.

## 2022-10-21 LAB — COMPREHENSIVE METABOLIC PANEL: eGFR: 110

## 2022-10-21 LAB — LIPID PANEL
Cholesterol: 146 (ref 0–200)
HDL: 44 (ref 35–70)
LDL Cholesterol: 86
LDl/HDL Ratio: 3.3
Triglycerides: 84 (ref 40–160)

## 2022-10-21 LAB — HEMOGLOBIN A1C: Hemoglobin A1C: 5.4

## 2022-10-21 LAB — BASIC METABOLIC PANEL
Creatinine: 0.7 (ref 0.5–1.1)
Glucose: 83

## 2022-10-23 ENCOUNTER — Encounter: Payer: Self-pay | Admitting: Internal Medicine

## 2022-11-17 NOTE — Patient Instructions (Incomplete)
Breast Self-Awareness Breast self-awareness is knowing how your breasts look and feel. You need to: Check your breasts on a regular basis. Tell your doctor about any changes. Become familiar with the look and feel of your breasts. This can help you catch a breast problem while it is still small and can be treated. You should do breast self-exams even if you have breast implants. What you need: A mirror. A well-lit room. A pillow or other soft object. How to do a breast self-exam Follow these steps to do a breast self-exam: Look for changes  Take off all the clothes above your waist. Stand in front of a mirror in a room with good lighting. Put your hands down at your sides. Compare your breasts in the mirror. Look for any difference between them, such as: A difference in shape. A difference in size. Wrinkles, dips, and bumps in one breast and not the other. Look at each breast for changes in the skin, such as: Redness. Scaly areas. Skin that has gotten thicker. Dimpling. Open sores (ulcers). Look for changes in your nipples, such as: Fluid coming out of a nipple. Fluid around a nipple. Bleeding. Dimpling. Redness. A nipple that looks pushed in (retracted), or that has changed position. Feel for changes Lie on your back. Feel each breast. To do this: Pick a breast to feel. Place a pillow under the shoulder closest to that breast. Put the arm closest to that breast behind your head. Feel the nipple area of that breast using the hand of your other arm. Feel the area with the pads of your three middle fingers by making small circles with your fingers. Use light, medium, and firm pressure. Continue the overlapping circles, moving downward over the breast. Keep making circles with your fingers. Stop when you feel your ribs. Start making circles with your fingers again, this time going upward until you reach your collarbone. Then, make circles outward across your breast and into your  armpit area. Squeeze your nipple. Check for discharge and lumps. Repeat these steps to check your other breast. Sit or stand in the tub or shower. With soapy water on your skin, feel each breast the same way you did when you were lying down. Write down what you find Writing down what you find can help you remember what to tell your doctor. Write down: What is normal for each breast. Any changes you find in each breast. These include: The kind of changes you find. A tender or painful breast. Any lump you find. Write down its size and where it is. When you last had your monthly period (menstrual cycle). General tips If you are breastfeeding, the best time to check your breasts is after you feed your baby or after you use a breast pump. If you get monthly bleeding, the best time to check your breasts is 5-7 days after your monthly cycle ends. With time, you will become comfortable with the self-exam. You will also start to know if there are changes in your breasts. Contact a doctor if: You see a change in the shape or size of your breasts or nipples. You see a change in the skin of your breast or nipples, such as red or scaly skin. You have fluid coming from your nipples that is not normal. You find a new lump or thick area. You have breast pain. You have any concerns about your breast health. Summary Breast self-awareness includes looking for changes in your breasts and feeling for changes   within your breasts. You should do breast self-awareness in front of a mirror in a well-lit room. If you get monthly periods (menstrual cycles), the best time to check your breasts is 5-7 days after your period ends. Tell your doctor about any changes you see in your breasts. Changes include changes in size, changes on the skin, painful or tender breasts, or fluid from your nipples that is not normal. This information is not intended to replace advice given to you by your health care provider. Make sure  you discuss any questions you have with your health care provider. Document Revised: 09/05/2021 Document Reviewed: 01/31/2021 Elsevier Patient Education  2024 [employer].

## 2022-11-18 ENCOUNTER — Encounter: Payer: Self-pay | Admitting: Obstetrics and Gynecology

## 2022-11-18 ENCOUNTER — Ambulatory Visit: Payer: Managed Care, Other (non HMO) | Admitting: Obstetrics and Gynecology

## 2022-11-18 VITALS — BP 93/56 | HR 69 | Ht 64.0 in | Wt 139.0 lb

## 2022-11-18 DIAGNOSIS — Z1159 Encounter for screening for other viral diseases: Secondary | ICD-10-CM

## 2022-11-18 DIAGNOSIS — Z124 Encounter for screening for malignant neoplasm of cervix: Secondary | ICD-10-CM

## 2022-11-18 DIAGNOSIS — Z8619 Personal history of other infectious and parasitic diseases: Secondary | ICD-10-CM

## 2022-11-18 DIAGNOSIS — Z01419 Encounter for gynecological examination (general) (routine) without abnormal findings: Secondary | ICD-10-CM

## 2022-11-18 NOTE — Progress Notes (Signed)
GYNECOLOGY ANNUAL PHYSICAL EXAM PROGRESS NOTE  Subjective:    Mikayla Brown is a 37 y.o. G97P1001 female who presents for an annual exam. The patient has no complaints today. The patient is not sexually active. The patient participates in regular exercise: no. Has the patient ever been transfused or tattooed?: no. The patient reports that there is not domestic violence in her life.   Does report experiencing 2 episodes of a yeast infection over the past several months. Initial one occurred after a round of antibiotics.  Second one she is not sure why it occurred (but possibly due to warm weather and sweating).  Treated both with OTC Monistat.    Menstrual History: Menarche age: 23 Patient's last menstrual period was 11/08/2022 (approximate). Period Cycle (Days): 28 Period Duration (Days): 7 Period Pattern: Regular Menstrual Flow: Light, Moderate, Heavy Dysmenorrhea: (!) Moderate Dysmenorrhea Symptoms: Cramping   Gynecologic History:  Contraception: none History of STI's: Denies Last Pap: 11/01/2019. Results were: normal. Reports remote h/o abnormal pap smears (h/o LEEP, 6 years ago).  Last mammogram: Not age appropriate   Upstream - 11/18/22 0823       Pregnancy Intention Screening   Does the patient want to become pregnant in the next year? No    Does the patient's partner want to become pregnant in the next year? No    Would the patient like to discuss contraceptive options today? No      Contraception Wrap Up   Current Method Abstinence    End Method Abstinence    Contraception Counseling Provided Yes               OB History  Gravida Para Term Preterm AB Living  1 1 1  0 0 1  SAB IAB Ectopic Multiple Live Births  0 0 0 0 1    # Outcome Date GA Lbr Len/2nd Weight Sex Type Anes PTL Lv  1 Term 06/12/17 [redacted]w[redacted]d / 02:13 8 lb 11.7 oz (3.96 kg) F Vag-Spont EPI  LIV     Name: Scronce,GIRL [place]     Apgar1: 7  Apgar5: 9    Past Medical History:   Diagnosis Date   Acne    followed by dermatology   Acne    Anxiety    Esophagitis    GERD (gastroesophageal reflux disease)    Heavy menses    HPV test positive    Painful menstruation    Rosacea    Stress    Weight loss     Past Surgical History:  Procedure Laterality Date   EYE SURGERY     LEEP      Family History  Problem Relation Age of Onset   Thyroid disease Mother    Breast cancer Maternal Grandmother    Stomach cancer Maternal Grandmother    Stomach cancer Other    Colon cancer Neg Hx    Diabetes Neg Hx    Heart disease Neg Hx    Ovarian cancer Neg Hx     Social History   Socioeconomic History   Marital status: Single    Spouse name: Not on file   Number of children: 0   Years of education: Not on file   Highest education level: Not on file  Occupational History    Employer: [employer]  Tobacco Use   Smoking status: Never   Smokeless tobacco: Never  Vaping Use   Vaping status: Never Used  Substance and Sexual Activity   Alcohol use: Not Currently  Drug use: No    Types: Marijuana    Comment: occas/nearly stopped   Sexual activity: Not Currently    Birth control/protection: None  Other Topics Concern   Not on file  Social History Narrative   Not on file   Social Determinants of Health   Financial Resource Strain: Not on file  Food Insecurity: Not on file  Transportation Needs: Not on file  Physical Activity: Inactive (10/12/2018)   Exercise Vital Sign    Days of Exercise per Week: 0 days    Minutes of Exercise per Session: 0 min  Stress: Not on file  Social Connections: Not on file  Intimate Partner Violence: Not on file    Current Outpatient Medications on File Prior to Visit  Medication Sig Dispense Refill   clindamycin (CLEOCIN T) 1 % lotion APPLY TOPICALLY TO FACE DAILY     No current facility-administered medications on file prior to visit.    Allergies  Allergen Reactions   Penicillins    Sulfa Antibiotics       Review of Systems Constitutional: negative for chills, fatigue, fevers and sweats Eyes: negative for irritation, redness and visual disturbance Ears, nose, mouth, throat, and face: negative for hearing loss, nasal congestion, snoring and tinnitus Respiratory: negative for asthma, cough, sputum Cardiovascular: negative for chest pain, dyspnea, exertional chest pressure/discomfort, irregular heart beat, palpitations and syncope Gastrointestinal: negative for abdominal pain, change in bowel habits, nausea and vomiting Genitourinary: negative for abnormal menstrual periods, genital lesions, sexual problems and vaginal discharge, dysuria and urinary incontinence Integument/breast: negative for breast lump, breast tenderness and nipple discharge Hematologic/lymphatic: negative for bleeding and easy bruising Musculoskeletal:negative for back pain and muscle weakness Neurological: negative for dizziness, headaches, vertigo and weakness Endocrine: negative for diabetic symptoms including polydipsia, polyuria and skin dryness Allergic/Immunologic: negative for hay fever and urticaria      Objective:  Blood pressure (!) 93/56, pulse 69, height 5\' 4"  (1.626 m), weight 139 lb (63 kg), last menstrual period 11/08/2022. Body mass index is 23.86 kg/m.    General Appearance:    Alert, cooperative, no distress, appears stated age  Head:    Normocephalic, without obvious abnormality, atraumatic  Eyes:    PERRL, conjunctiva/corneas clear, EOM's intact, both eyes  Ears:    Normal external ear canals, both ears  Nose:   Nares normal, septum midline, mucosa normal, no drainage or sinus tenderness  Throat:   Lips, mucosa, and tongue normal; teeth and gums normal  Neck:   Supple, symmetrical, trachea midline, no adenopathy; thyroid: no enlargement/tenderness/nodules; no carotid bruit or JVD  Back:     Symmetric, no curvature, ROM normal, no CVA tenderness  Lungs:     Clear to auscultation bilaterally,  respirations unlabored  Chest Wall:    No tenderness or deformity   Heart:    Regular rate and rhythm, S1 and S2 normal, no murmur, rub or gallop  Breast Exam:    No tenderness, masses, or nipple abnormality  Abdomen:     Soft, non-tender, bowel sounds active all four quadrants, no masses, no organomegaly.    Genitalia:    Pelvic:external genitalia normal, vagina without lesions, discharge, or tenderness, rectovaginal septum  normal. Cervix normal in appearance, no cervical motion tenderness, no adnexal masses or tenderness.  Uterus normal size, shape, mobile, regular contours, nontender.  Rectal:    Normal external sphincter.  No hemorrhoids appreciated. Internal exam not done.   Extremities:   Extremities normal, atraumatic, no cyanosis or edema  Pulses:  2+ and symmetric all extremities  Skin:   Skin color, texture, turgor normal, no rashes or lesions  Lymph nodes:   Cervical, supraclavicular, and axillary nodes normal  Neurologic:   CNII-XII intact, normal strength, sensation and reflexes throughout   .  Labs:  Lab Results  Component Value Date   WBC 6.6 07/02/2021   HGB 13.8 07/02/2021   HCT 40.7 07/02/2021   MCV 88 07/02/2021   PLT 260 07/02/2021    Lab Results  Component Value Date   CREATININE 0.71 07/02/2021   BUN 10 07/02/2021   NA 139 07/02/2021   K 4.7 07/02/2021   CL 102 07/02/2021   CO2 22 07/02/2021    Lab Results  Component Value Date   ALT 7 07/02/2021   AST 11 07/02/2021   ALKPHOS 60 07/02/2021   BILITOT 0.9 07/02/2021    Lab Results  Component Value Date   TSH 0.772 07/02/2021     Assessment:   1. Encounter for well woman exam with routine gynecological exam   2. Cervical cancer screening   3. History of candidiasis of vagina      Plan:  Blood tests: None ordered. Reports recent bloodwork through her job [employer]). Breast self exam technique reviewed and patient encouraged to perform self-exam monthly. Contraception:  abstinence. Discussed healthy lifestyle modifications. Mammogram  Not age appropriate . To begin screens at age 83.  Pap smear ordered. Discussed that if she continues to have recurrences of vaginal candidiasis, further workup may be needed. Follow up in 1 year for annual exam   Hildred Laser, MD [facility]

## 2023-04-07 ENCOUNTER — Encounter: Payer: 59 | Admitting: Internal Medicine

## 2023-04-09 ENCOUNTER — Encounter: Payer: 59 | Admitting: Internal Medicine

## 2023-04-15 NOTE — Progress Notes (Signed)
 Subjective:    Patient ID: Mikayla Brown, female    DOB: 10/13/1985, 38 y.o.   MRN: [id number]  Patient here for  Chief Complaint  Patient presents with   Annual Exam    HPI Here for a physical exam. Saw Dr Connell 11/2022 - gyn exam. PAP 11/18/22 - ASCUS, HPV negative. Per pt, recommended f/u appt in one year. Some increased stress recently.  Discussed.  Stays physically active. Breathing stable. Has had some issues with nausea - intermittent.  Appears to be worse in am. Is working first shift now. Was not previously on a schedule of eating early in the am. Discussed her weight loss. Has been multifactorial. Has stopped alcohol.  Some family stress has stabilized. Recently weight has stabilized. She is monitoring. Also, recently had cavities filled. Is continuing to have what she feels is nerve pain. Plans to call her dentist today. Is not affecting her eating.    Past Medical History:  Diagnosis Date   Acne    followed by dermatology   Acne    Anxiety    Esophagitis    GERD (gastroesophageal reflux disease)    Heavy menses    HPV test positive    Painful menstruation    Rosacea    Stress    Weight loss    Past Surgical History:  Procedure Laterality Date   EYE SURGERY     LEEP     Family History  Problem Relation Age of Onset   Thyroid disease Mother    Breast cancer Maternal Grandmother    Stomach cancer Maternal Grandmother    Stomach cancer Other    Colon cancer Neg Hx    Diabetes Neg Hx    Heart disease Neg Hx    Ovarian cancer Neg Hx    Social History   Socioeconomic History   Marital status: Single    Spouse name: Not on file   Number of children: 0   Years of education: Not on file   Highest education level: Not on file  Occupational History    Employer: [employer]  Tobacco Use   Smoking status: Never   Smokeless tobacco: Never  Vaping Use   Vaping status: Never Used  Substance and Sexual Activity   Alcohol use: Not Currently   Drug use: No     Types: Marijuana    Comment: occas/nearly stopped   Sexual activity: Not Currently    Birth control/protection: None  Other Topics Concern   Not on file  Social History Narrative   Not on file   Social Drivers of Health   Financial Resource Strain: Not on file  Food Insecurity: Not on file  Transportation Needs: Not on file  Physical Activity: Inactive (10/12/2018)   Exercise Vital Sign    Days of Exercise per Week: 0 days    Minutes of Exercise per Session: 0 min  Stress: Not on file  Social Connections: Not on file     Review of Systems  Constitutional:  Negative for appetite change.       Weight as outlined.   HENT:  Negative for congestion, sinus pressure and sore throat.   Eyes:  Negative for pain and visual disturbance.  Respiratory:  Negative for cough, chest tightness and shortness of breath.   Cardiovascular:  Negative for chest pain, palpitations and leg swelling.  Gastrointestinal:  Negative for abdominal pain, diarrhea, nausea and vomiting.  Genitourinary:  Negative for difficulty urinating and dysuria.  Musculoskeletal:  Negative  for joint swelling and myalgias.  Skin:  Negative for color change and rash.  Neurological:  Negative for dizziness and headaches.  Hematological:  Negative for adenopathy. Does not bruise/bleed easily.  Psychiatric/Behavioral:  Negative for agitation and dysphoric mood.        Objective:     BP 108/68   Pulse 82   Temp 97.9 F (36.6 C)   Resp 16   Ht 5' 4 (1.626 m)   Wt 129 lb 9.6 oz (58.8 kg)   SpO2 99%   BMI 22.25 kg/m  Wt Readings from Last 3 Encounters:  04/16/23 129 lb 9.6 oz (58.8 kg)  11/18/22 139 lb (63 kg)  04/03/22 153 lb 2 oz (69.5 kg)    Physical Exam Vitals reviewed.  Constitutional:      General: She is not in acute distress.    Appearance: Normal appearance. She is well-developed.  HENT:     Head: Normocephalic and atraumatic.     Right Ear: External ear normal.     Left Ear: External ear normal.      Mouth/Throat:     Pharynx: No oropharyngeal exudate or posterior oropharyngeal erythema.  Eyes:     General: No scleral icterus.       Right eye: No discharge.        Left eye: No discharge.     Conjunctiva/sclera: Conjunctivae normal.  Neck:     Thyroid: No thyromegaly.  Cardiovascular:     Rate and Rhythm: Normal rate and regular rhythm.  Pulmonary:     Effort: No tachypnea, accessory muscle usage or respiratory distress.     Breath sounds: Normal breath sounds. No decreased breath sounds, wheezing or rhonchi.  Chest:  Breasts:    Right: No inverted nipple, mass, nipple discharge or tenderness (no axillary adenopathy).     Left: No inverted nipple, mass, nipple discharge or tenderness (no axilarry adenopathy).  Abdominal:     General: Bowel sounds are normal.     Palpations: Abdomen is soft.     Tenderness: There is no abdominal tenderness.  Musculoskeletal:        General: No swelling or tenderness.     Cervical back: Neck supple.  Lymphadenopathy:     Cervical: No cervical adenopathy.  Skin:    Findings: No erythema or rash.  Neurological:     Mental Status: She is alert and oriented to person, place, and time.  Psychiatric:        Mood and Affect: Mood normal.        Behavior: Behavior normal.      Outpatient Encounter Medications as of 04/16/2023  Medication Sig   clindamycin (CLEOCIN T) 1 % lotion APPLY TOPICALLY TO FACE DAILY   No facility-administered encounter medications on file as of 04/16/2023.     Lab Results  Component Value Date   WBC 6.6 07/02/2021   HGB 13.8 07/02/2021   HCT 40.7 07/02/2021   PLT 260 07/02/2021   GLUCOSE 90 07/02/2021   CHOL 146 10/21/2022   TRIG 84 10/21/2022   HDL 44 10/21/2022   LDLCALC 86 10/21/2022   ALT 7 07/02/2021   AST 11 07/02/2021   NA 139 07/02/2021   K 4.7 07/02/2021   CL 102 07/02/2021   CREATININE 0.7 10/21/2022   BUN 10 07/02/2021   CO2 22 07/02/2021   TSH 0.772 07/02/2021       Assessment & Plan:   Routine general medical examination at a health care facility  Health  care maintenance Assessment & Plan: Physical today 04/16/23.  PAP 11/18/22 - ASCUS, HPV negative - through gyn. Dr Connell - recommended mammogram age 55.    Need for influenza vaccination -     Flu vaccine trivalent PF, 6mos and older(Flulaval,Afluria,Fluarix,Fluzone)  stress Assessment & Plan: Increased stress - discussed.  Has good support.  Follow.     Nausea Assessment & Plan: Has had some issues with nausea - intermittent.  Appears to be worse in am. Is working first shift now. Was not previously on a schedule of eating early in the am. Discussed her weight loss. Has been multifactorial. Has stopped alcohol.  Some family stress has stabilized. Recently weight has stabilized. She is monitoring.      Allena Hamilton, MD

## 2023-04-15 NOTE — Assessment & Plan Note (Signed)
 Physical today 04/16/23.  PAP 11/18/22 - ASCUS, HPV negative - through gyn. Dr Valentino Saxon - recommended mammogram age 38.

## 2023-04-16 ENCOUNTER — Ambulatory Visit (INDEPENDENT_AMBULATORY_CARE_PROVIDER_SITE_OTHER): Payer: Managed Care, Other (non HMO) | Admitting: Internal Medicine

## 2023-04-16 ENCOUNTER — Encounter: Payer: Self-pay | Admitting: Internal Medicine

## 2023-04-16 VITALS — BP 108/68 | HR 82 | Temp 97.9°F | Resp 16 | Ht 64.0 in | Wt 129.6 lb

## 2023-04-16 DIAGNOSIS — Z0000 Encounter for general adult medical examination without abnormal findings: Secondary | ICD-10-CM | POA: Diagnosis not present

## 2023-04-16 DIAGNOSIS — R110 Nausea: Secondary | ICD-10-CM | POA: Diagnosis not present

## 2023-04-16 DIAGNOSIS — F419 Anxiety disorder, unspecified: Secondary | ICD-10-CM | POA: Diagnosis not present

## 2023-04-16 DIAGNOSIS — Z23 Encounter for immunization: Secondary | ICD-10-CM | POA: Diagnosis not present

## 2023-04-19 ENCOUNTER — Encounter: Payer: Self-pay | Admitting: Internal Medicine

## 2023-04-19 DIAGNOSIS — R110 Nausea: Secondary | ICD-10-CM | POA: Insufficient documentation

## 2023-04-19 NOTE — Assessment & Plan Note (Signed)
 Has had some issues with nausea - intermittent.  Appears to be worse in am. Is working first shift now. Was not previously on a schedule of eating early in the am. Discussed her weight loss. Has been multifactorial. Has stopped alcohol.  Some family stress has stabilized. Recently weight has stabilized. She is monitoring.

## 2023-04-19 NOTE — Assessment & Plan Note (Signed)
Increased stress - discussed.  Has good support.  Follow.

## 2023-05-16 ENCOUNTER — Encounter: Payer: Self-pay | Admitting: Internal Medicine

## 2023-10-15 ENCOUNTER — Ambulatory Visit: Payer: Managed Care, Other (non HMO) | Admitting: Internal Medicine

## 2023-10-15 ENCOUNTER — Encounter: Payer: Self-pay | Admitting: Internal Medicine

## 2023-10-15 VITALS — BP 110/64 | HR 86 | Temp 98.7°F | Resp 18 | Ht 64.0 in | Wt 132.2 lb

## 2023-10-15 DIAGNOSIS — F419 Anxiety disorder, unspecified: Secondary | ICD-10-CM | POA: Diagnosis not present

## 2023-10-15 DIAGNOSIS — R110 Nausea: Secondary | ICD-10-CM | POA: Diagnosis not present

## 2023-10-15 NOTE — Progress Notes (Signed)
 Subjective:    Patient ID: Mikayla Brown, female    DOB: 1985-12-07, 38 y.o.   MRN: [id number]  Patient here for  Chief Complaint  Patient presents with   Weight Check    6 month follow up     HPI Here for a scheduled follow up - follow up regarding increased stress. She reports she is doing relatively well. Stays active. Having some issues with intermittent nausea. Noticed increased nausea with recent doxycycline. Will notice some nausea in the am. She is eating. Weight is stable. Handling stress. Does not feel needs any further intervention. Bowels moving.    Past Medical History:  Diagnosis Date   Acne    followed by dermatology   Acne    Allergy    Anxiety    Esophagitis    GERD (gastroesophageal reflux disease)    Heavy menses    HPV test positive    Painful menstruation    Rosacea    Stress    Weight loss    Past Surgical History:  Procedure Laterality Date   EYE SURGERY     LEEP     Family History  Problem Relation Age of Onset   Thyroid disease Mother    Breast cancer Maternal Grandmother    Stomach cancer Maternal Grandmother    Cancer Maternal Grandmother    Stomach cancer Other    Colon cancer Neg Hx    Diabetes Neg Hx    Heart disease Neg Hx    Ovarian cancer Neg Hx    Social History   Socioeconomic History   Marital status: Single    Spouse name: Not on file   Number of children: 0   Years of education: Not on file   Highest education level: Not on file  Occupational History    Employer: [employer]  Tobacco Use   Smoking status: Never   Smokeless tobacco: Never  Vaping Use   Vaping status: Never Used  Substance and Sexual Activity   Alcohol use: Not Currently   Drug use: No    Types: Marijuana    Comment: occas/nearly stopped   Sexual activity: Not Currently    Birth control/protection: None  Other Topics Concern   Not on file  Social History Narrative   Not on file   Social Drivers of Health   Financial Resource Strain: Not  on file  Food Insecurity: Not on file  Transportation Needs: Not on file  Physical Activity: Inactive (10/12/2018)   Exercise Vital Sign    Days of Exercise per Week: 0 days    Minutes of Exercise per Session: 0 min  Stress: Not on file  Social Connections: Not on file     Review of Systems  Constitutional:  Negative for appetite change and unexpected weight change.  HENT:  Negative for congestion and sinus pressure.   Respiratory:  Negative for cough, chest tightness and shortness of breath.   Cardiovascular:  Negative for chest pain, palpitations and leg swelling.  Gastrointestinal:  Positive for nausea. Negative for abdominal pain and vomiting.  Genitourinary:  Negative for difficulty urinating and dysuria.  Musculoskeletal:  Negative for joint swelling and myalgias.  Skin:  Negative for color change and rash.  Neurological:  Negative for dizziness and headaches.  Psychiatric/Behavioral:  Negative for agitation and dysphoric mood.        Objective:     BP 110/64   Pulse 86   Temp 98.7 F (37.1 C)   Resp 18  Ht 5' 4 (1.626 m)   Wt 132 lb 4 oz (60 kg)   LMP 10/12/2023 (Exact Date)   SpO2 98%   BMI 22.70 kg/m  Wt Readings from Last 3 Encounters:  10/15/23 132 lb 4 oz (60 kg)  04/16/23 129 lb 9.6 oz (58.8 kg)  11/18/22 139 lb (63 kg)    Physical Exam Vitals reviewed.  Constitutional:      General: She is not in acute distress.    Appearance: Normal appearance.  HENT:     Head: Normocephalic and atraumatic.     Right Ear: External ear normal.     Left Ear: External ear normal.     Mouth/Throat:     Pharynx: No oropharyngeal exudate or posterior oropharyngeal erythema.  Eyes:     General: No scleral icterus.       Right eye: No discharge.        Left eye: No discharge.     Conjunctiva/sclera: Conjunctivae normal.  Neck:     Thyroid: No thyromegaly.  Cardiovascular:     Rate and Rhythm: Normal rate and regular rhythm.  Pulmonary:     Effort: No  respiratory distress.     Breath sounds: Normal breath sounds. No wheezing.  Abdominal:     General: Bowel sounds are normal.     Palpations: Abdomen is soft.     Tenderness: There is no abdominal tenderness.  Musculoskeletal:        General: No swelling or tenderness.     Cervical back: Neck supple. No tenderness.  Lymphadenopathy:     Cervical: No cervical adenopathy.  Skin:    Findings: No erythema or rash.  Neurological:     Mental Status: She is alert.  Psychiatric:        Mood and Affect: Mood normal.        Behavior: Behavior normal.         Outpatient Encounter Medications as of 10/15/2023  Medication Sig   clindamycin (CLEOCIN T) 1 % lotion APPLY TOPICALLY TO FACE DAILY   No facility-administered encounter medications on file as of 10/15/2023.     Lab Results  Component Value Date   WBC 6.6 07/02/2021   HGB 13.8 07/02/2021   HCT 40.7 07/02/2021   PLT 260 07/02/2021   GLUCOSE 90 07/02/2021   CHOL 146 10/21/2022   TRIG 84 10/21/2022   HDL 44 10/21/2022   LDLCALC 86 10/21/2022   ALT 7 07/02/2021   AST 11 07/02/2021   NA 139 07/02/2021   K 4.7 07/02/2021   CL 102 07/02/2021   CREATININE 0.7 10/21/2022   BUN 10 07/02/2021   CO2 22 07/02/2021   TSH 0.772 07/02/2021       Assessment & Plan:  stress Assessment & Plan: Discussed. Overall she feels she is handling things relatively well. Does not feel needs any further intervention. Follow.    Nausea Assessment & Plan: Has had some issues with nausea - intermittent.  Appears to be worse in am.  Has had issues intermittently. Discussed further w/up. Will follow. Monitor for triggers. Call with update.       Allena Hamilton, MD

## 2023-10-16 ENCOUNTER — Encounter: Payer: Self-pay | Admitting: Internal Medicine

## 2023-10-16 NOTE — Assessment & Plan Note (Signed)
 Discussed. Overall she feels she is handling things relatively well. Does not feel needs any further intervention. Follow.

## 2023-10-16 NOTE — Assessment & Plan Note (Signed)
 Has had some issues with nausea - intermittent.  Appears to be worse in am.  Has had issues intermittently. Discussed further w/up. Will follow. Monitor for triggers. Call with update.

## 2024-03-16 ENCOUNTER — Encounter: Payer: Self-pay | Admitting: Internal Medicine

## 2024-03-16 ENCOUNTER — Ambulatory Visit: Admitting: Internal Medicine

## 2024-03-16 VITALS — BP 110/70 | HR 90 | Temp 98.4°F | Ht 64.0 in | Wt 140.0 lb

## 2024-03-16 DIAGNOSIS — Z124 Encounter for screening for malignant neoplasm of cervix: Secondary | ICD-10-CM

## 2024-03-16 DIAGNOSIS — Z01419 Encounter for gynecological examination (general) (routine) without abnormal findings: Secondary | ICD-10-CM

## 2024-03-16 DIAGNOSIS — R110 Nausea: Secondary | ICD-10-CM | POA: Diagnosis not present

## 2024-03-16 DIAGNOSIS — F419 Anxiety disorder, unspecified: Secondary | ICD-10-CM

## 2024-03-16 DIAGNOSIS — R21 Rash and other nonspecific skin eruption: Secondary | ICD-10-CM | POA: Diagnosis not present

## 2024-03-16 MED ORDER — SERTRALINE HCL 50 MG PO TABS: ORAL_TABLET | ORAL | 1 refills | Status: DC

## 2024-03-16 NOTE — Progress Notes (Unsigned)
 Subjective:    Patient ID: Mikayla Brown, female    DOB: Jan 12, 1986, 38 y.o.   MRN: [id number]  Patient here for  Chief Complaint  Patient presents with  . Medical Management of Chronic Issues  . Acne    HPI Here for a scheduled follow up - follow up regarding increased stress. Sees gyn - had pap 11/2022 - ASCUS - negative HPV. Dr Connell recommended routine screening q 3 years.    Past Medical History:  Diagnosis Date  . Acne    followed by dermatology  . Acne   . Allergy   . Anxiety   . Esophagitis   . GERD (gastroesophageal reflux disease)   . Heavy menses   . HPV test positive   . Painful menstruation   . Rosacea   . Stress   . Weight loss    Past Surgical History:  Procedure Laterality Date  . EYE SURGERY    . LEEP     Family History  Problem Relation Age of Onset  . Thyroid disease Mother   . Breast cancer Maternal Grandmother   . Stomach cancer Maternal Grandmother   . Cancer Maternal Grandmother   . Stomach cancer Other   . Colon cancer Neg Hx   . Diabetes Neg Hx   . Heart disease Neg Hx   . Ovarian cancer Neg Hx    Social History   Socioeconomic History  . Marital status: Single    Spouse name: Not on file  . Number of children: 0  . Years of education: Not on file  . Highest education level: Not on file  Occupational History    Employer: [employer]  Tobacco Use  . Smoking status: Never  . Smokeless tobacco: Never  Vaping Use  . Vaping status: Never Used  Substance and Sexual Activity  . Alcohol use: Not Currently  . Drug use: No    Types: Marijuana    Comment: occas/nearly stopped  . Sexual activity: Not Currently    Birth control/protection: None  Other Topics Concern  . Not on file  Social History Narrative  . Not on file   Social Drivers of Health   Financial Resource Strain: Not on file  Food Insecurity: Not on file  Transportation Needs: Not on file  Physical Activity: Inactive (10/12/2018)   Exercise Vital Sign   . Days  of Exercise per Week: 0 days   . Minutes of Exercise per Session: 0 min  Stress: Not on file  Social Connections: Not on file     Review of Systems     Objective:     BP 110/70   Pulse 90   Temp 98.4 F (36.9 C) (Oral)   Ht 5' 4 (1.626 m)   Wt 140 lb (63.5 kg)   SpO2 99%   BMI 24.03 kg/m  Wt Readings from Last 3 Encounters:  03/16/24 140 lb (63.5 kg)  10/15/23 132 lb 4 oz (60 kg)  04/16/23 129 lb 9.6 oz (58.8 kg)    Physical Exam  {Perform Simple Foot Exam  Perform Detailed exam:1} {Insert foot Exam (Optional):30965}   Outpatient Encounter Medications as of 03/16/2024  Medication Sig  . clindamycin (CLEOCIN T) 1 % lotion APPLY TOPICALLY TO FACE DAILY   No facility-administered encounter medications on file as of 03/16/2024.     Lab Results  Component Value Date   WBC 6.6 07/02/2021   HGB 13.8 07/02/2021   HCT 40.7 07/02/2021   PLT 260 07/02/2021  GLUCOSE 90 07/02/2021   CHOL 146 10/21/2022   TRIG 84 10/21/2022   HDL 44 10/21/2022   LDLCALC 86 10/21/2022   ALT 7 07/02/2021   AST 11 07/02/2021   NA 139 07/02/2021   K 4.7 07/02/2021   CL 102 07/02/2021   CREATININE 0.7 10/21/2022   BUN 10 07/02/2021   CO2 22 07/02/2021   TSH 0.772 07/02/2021    No results found.     Assessment & Plan:  Encounter for well woman exam with routine gynecological exam  Cervical cancer screening  Anxiety     Allena Hamilton, MD

## 2024-03-17 ENCOUNTER — Encounter: Payer: Self-pay | Admitting: Internal Medicine

## 2024-03-18 NOTE — Telephone Encounter (Signed)
 Reviewed her latest message. Please call and confirm she is doing ok and see if she needs anything.

## 2024-03-20 ENCOUNTER — Encounter: Payer: Self-pay | Admitting: Internal Medicine

## 2024-03-20 DIAGNOSIS — R21 Rash and other nonspecific skin eruption: Secondary | ICD-10-CM | POA: Insufficient documentation

## 2024-03-20 NOTE — Assessment & Plan Note (Signed)
 Aggravated by the N95 mask. Discussed. Using clindamycin cream. Contact dermatology for earlier f/u.

## 2024-03-20 NOTE — Assessment & Plan Note (Signed)
 Recently took doxycycline. Feel GI symptoms related. Has stopped doxycycline. Follow. Notify me if persistent.

## 2024-03-20 NOTE — Assessment & Plan Note (Signed)
 Increased stress. Discussed. Feels she needs something to help level things out. Will start zoloft  low dose. Follow. Get her back in soon to reassess. Call with update.

## 2024-03-21 NOTE — Telephone Encounter (Signed)
 Lvm. Okay to relay. If relayed please notify office

## 2024-03-21 NOTE — Telephone Encounter (Signed)
 Her message is from yesterday. Please call her and let her know that received message today and calling for update - how she is doing today. Is she having persistent headache?  Other symptoms?

## 2024-03-23 NOTE — Telephone Encounter (Addendum)
 Lvm, mychart message sent. OKAY TO RELAY PCP QUESTIONS

## 2024-05-03 ENCOUNTER — Other Ambulatory Visit: Payer: Self-pay | Admitting: Internal Medicine

## 2024-05-13 ENCOUNTER — Telehealth: Payer: Self-pay | Admitting: Internal Medicine

## 2024-05-13 ENCOUNTER — Ambulatory Visit: Payer: Self-pay | Admitting: Internal Medicine

## 2024-05-13 LAB — TSH: TSH: 0.988 u[IU]/mL (ref 0.450–4.500)

## 2024-05-13 LAB — CBC WITH DIFFERENTIAL/PLATELET
Basophils Absolute: 0.1 10*3/uL (ref 0.0–0.2)
Basos: 1 %
EOS (ABSOLUTE): 0.1 10*3/uL (ref 0.0–0.4)
Eos: 2 %
Hematocrit: 41 % (ref 34.0–46.6)
Hemoglobin: 13.3 g/dL (ref 11.1–15.9)
Immature Grans (Abs): 0 10*3/uL (ref 0.0–0.1)
Immature Granulocytes: 0 %
Lymphocytes Absolute: 1.9 10*3/uL (ref 0.7–3.1)
Lymphs: 26 %
MCH: 30.2 pg (ref 26.6–33.0)
MCHC: 32.4 g/dL (ref 31.5–35.7)
MCV: 93 fL (ref 79–97)
Monocytes Absolute: 0.5 10*3/uL (ref 0.1–0.9)
Monocytes: 7 %
Neutrophils Absolute: 4.7 10*3/uL (ref 1.4–7.0)
Neutrophils: 64 %
Platelets: 282 10*3/uL (ref 150–450)
RBC: 4.41 x10E6/uL (ref 3.77–5.28)
RDW: 12 % (ref 11.7–15.4)
WBC: 7.3 10*3/uL (ref 3.4–10.8)

## 2024-05-13 LAB — COMPREHENSIVE METABOLIC PANEL WITH GFR
ALT: 10 [IU]/L (ref 0–32)
AST: 15 [IU]/L (ref 0–40)
Albumin: 4.6 g/dL (ref 3.9–4.9)
Alkaline Phosphatase: 71 [IU]/L (ref 41–116)
BUN/Creatinine Ratio: 13 (ref 9–23)
BUN: 8 mg/dL (ref 6–20)
Bilirubin Total: 0.8 mg/dL (ref 0.0–1.2)
CO2: 23 mmol/L (ref 20–29)
Calcium: 9.4 mg/dL (ref 8.7–10.2)
Chloride: 100 mmol/L (ref 96–106)
Creatinine, Ser: 0.62 mg/dL (ref 0.57–1.00)
Globulin, Total: 2.1 g/dL (ref 1.5–4.5)
Glucose: 116 mg/dL — ABNORMAL HIGH (ref 70–99)
Potassium: 4.4 mmol/L (ref 3.5–5.2)
Sodium: 137 mmol/L (ref 134–144)
Total Protein: 6.7 g/dL (ref 6.0–8.5)
eGFR: 117 mL/min/{1.73_m2}

## 2024-05-13 LAB — LIPID PANEL
Chol/HDL Ratio: 2.4 ratio (ref 0.0–4.4)
Cholesterol, Total: 165 mg/dL (ref 100–199)
HDL: 70 mg/dL
LDL Chol Calc (NIH): 82 mg/dL (ref 0–99)
Triglycerides: 69 mg/dL (ref 0–149)
VLDL Cholesterol Cal: 13 mg/dL (ref 5–40)

## 2024-05-16 ENCOUNTER — Ambulatory Visit: Admitting: Internal Medicine

## 2024-05-18 ENCOUNTER — Ambulatory Visit
Admission: RE | Admit: 2024-05-18 | Discharge: 2024-05-18 | Disposition: A | Source: Home / Self Care | Attending: Internal Medicine | Admitting: Internal Medicine

## 2024-05-18 ENCOUNTER — Ambulatory Visit: Admitting: Internal Medicine

## 2024-05-18 ENCOUNTER — Encounter: Payer: Self-pay | Admitting: Internal Medicine

## 2024-05-18 ENCOUNTER — Ambulatory Visit
Admission: RE | Admit: 2024-05-18 | Discharge: 2024-05-18 | Disposition: A | Source: Ambulatory Visit | Attending: Internal Medicine | Admitting: Internal Medicine

## 2024-05-18 VITALS — BP 106/70 | HR 80 | Temp 98.5°F | Ht 64.0 in | Wt 138.6 lb

## 2024-05-18 DIAGNOSIS — F419 Anxiety disorder, unspecified: Secondary | ICD-10-CM

## 2024-05-18 DIAGNOSIS — M545 Low back pain, unspecified: Secondary | ICD-10-CM | POA: Insufficient documentation

## 2024-05-18 DIAGNOSIS — R739 Hyperglycemia, unspecified: Secondary | ICD-10-CM

## 2024-05-18 MED ORDER — SERTRALINE HCL 50 MG PO TABS: 50.0000 mg | ORAL_TABLET | Freq: Every day | ORAL | 1 refills | Status: AC

## 2024-05-18 NOTE — Progress Notes (Unsigned)
 "  Subjective:    Patient ID: Mikayla Brown, female    DOB: November 02, 1985, 39 y.o.   MRN: [id number]  Patient here for No chief complaint on file.   HPI Here for a scheduled follow up - follow up regarding increased stress. Work in appt 03/16/24 - increased stress. Work stress. Started zoloft .    Past Medical History:  Diagnosis Date   Acne    followed by dermatology   Acne    Allergy    Anxiety    Esophagitis    GERD (gastroesophageal reflux disease)    Heavy menses    HPV test positive    Painful menstruation    Rosacea    Stress    Weight loss    Past Surgical History:  Procedure Laterality Date   EYE SURGERY     LEEP     Family History  Problem Relation Age of Onset   Thyroid disease Mother    Breast cancer Maternal Grandmother    Stomach cancer Maternal Grandmother    Cancer Maternal Grandmother    Stomach cancer Other    Colon cancer Neg Hx    Diabetes Neg Hx    Heart disease Neg Hx    Ovarian cancer Neg Hx    Social History   Socioeconomic History   Marital status: Single    Spouse name: Not on file   Number of children: 0   Years of education: Not on file   Highest education level: Not on file  Occupational History    Employer: [employer]  Tobacco Use   Smoking status: Never   Smokeless tobacco: Never  Vaping Use   Vaping status: Never Used  Substance and Sexual Activity   Alcohol use: Not Currently   Drug use: No    Types: Marijuana    Comment: occas/nearly stopped   Sexual activity: Not Currently    Birth control/protection: None  Other Topics Concern   Not on file  Social History Narrative   Not on file   Social Drivers of Health   Tobacco Use: Low Risk (03/20/2024)   Patient History    Smoking Tobacco Use: Never    Smokeless Tobacco Use: Never    Passive Exposure: Not on file  Financial Resource Strain: Not on file  Food Insecurity: Not on file  Transportation Needs: Not on file  Physical Activity: Not on file  Stress: Not on  file  Social Connections: Not on file  Depression (EYV7-0): Low Risk (03/16/2024)   Depression (PHQ2-9)    PHQ-2 Score: 3  Alcohol Screen: Not on file  Housing: Not on file  Utilities: Not on file  Health Literacy: Not on file     Review of Systems     Objective:     There were no vitals taken for this visit. Wt Readings from Last 3 Encounters:  03/16/24 140 lb (63.5 kg)  10/15/23 132 lb 4 oz (60 kg)  04/16/23 129 lb 9.6 oz (58.8 kg)    Physical Exam  {Perform Simple Foot Exam  Perform Detailed exam:1} {Insert foot Exam (Optional):30965}   Outpatient Encounter Medications as of 05/18/2024  Medication Sig   clindamycin (CLEOCIN T) 1 % lotion APPLY TOPICALLY TO FACE DAILY   sertraline  (ZOLOFT ) 50 MG tablet TAKE 1/2 TABLET BY MOUTH DAILY  FOR 1 WEEK THEN 1 TABLET DAILY   No facility-administered encounter medications on file as of 05/18/2024.     Lab Results  Component Value Date   WBC  7.3 05/12/2024   HGB 13.3 05/12/2024   HCT 41.0 05/12/2024   PLT 282 05/12/2024   GLUCOSE 116 (H) 05/12/2024   CHOL 165 05/12/2024   TRIG 69 05/12/2024   HDL 70 05/12/2024   LDLCALC 82 05/12/2024   ALT 10 05/12/2024   AST 15 05/12/2024   NA 137 05/12/2024   K 4.4 05/12/2024   CL 100 05/12/2024   CREATININE 0.62 05/12/2024   BUN 8 05/12/2024   CO2 23 05/12/2024   TSH 0.988 05/12/2024    No results found.     Assessment & Plan:  Anxiety     Allena Hamilton, MD "

## 2024-05-19 ENCOUNTER — Ambulatory Visit: Payer: Self-pay | Admitting: Internal Medicine

## 2024-05-19 NOTE — Telephone Encounter (Signed)
 Copied from CRM #[id number]. Topic: Clinical - Lab/Test Results >> May 19, 2024  3:08 PM Taleah C wrote: Reason for CRM: Pt called to return dr. Freda call regarding imaging results. Please call pt.

## 2024-07-18 ENCOUNTER — Ambulatory Visit: Admitting: Internal Medicine

## 2024-08-17 ENCOUNTER — Encounter: Admitting: Internal Medicine
